# Patient Record
Sex: Male | Born: 1940 | Race: Black or African American | Hispanic: No | Smoking: Former smoker
Health system: Southern US, Community
[De-identification: ages and names within clinical notes are randomized; demographics above are authoritative.]

## PROBLEM LIST (undated history)

## (undated) DIAGNOSIS — G4730 Sleep apnea, unspecified: Secondary | ICD-10-CM

## (undated) DIAGNOSIS — E785 Hyperlipidemia, unspecified: Secondary | ICD-10-CM

## (undated) DIAGNOSIS — I2510 Atherosclerotic heart disease of native coronary artery without angina pectoris: Secondary | ICD-10-CM

## (undated) DIAGNOSIS — E669 Obesity, unspecified: Secondary | ICD-10-CM

## (undated) DIAGNOSIS — I10 Essential (primary) hypertension: Secondary | ICD-10-CM

## (undated) HISTORY — DX: Sleep apnea, unspecified: G47.30

## (undated) HISTORY — DX: Obesity, unspecified: E66.9

## (undated) HISTORY — DX: Essential (primary) hypertension: I10

## (undated) HISTORY — DX: Hyperlipidemia, unspecified: E78.5

## (undated) HISTORY — DX: Atherosclerotic heart disease of native coronary artery without angina pectoris: I25.10

---

## 2002-07-09 ENCOUNTER — Encounter: Payer: Self-pay | Admitting: Internal Medicine

## 2002-07-09 ENCOUNTER — Encounter: Admission: RE | Admit: 2002-07-09 | Discharge: 2002-07-09 | Payer: Self-pay | Admitting: Internal Medicine

## 2003-11-01 HISTORY — PX: DOPPLER ECHOCARDIOGRAPHY: SHX263

## 2003-11-01 HISTORY — PX: OTHER SURGICAL HISTORY: SHX169

## 2004-03-02 ENCOUNTER — Ambulatory Visit (HOSPITAL_COMMUNITY): Admission: RE | Admit: 2004-03-02 | Discharge: 2004-03-02 | Payer: Self-pay | Admitting: Gastroenterology

## 2005-02-15 ENCOUNTER — Encounter: Admission: RE | Admit: 2005-02-15 | Discharge: 2005-02-15 | Payer: Self-pay | Admitting: Internal Medicine

## 2006-04-21 ENCOUNTER — Encounter: Admission: RE | Admit: 2006-04-21 | Discharge: 2006-04-21 | Payer: Self-pay | Admitting: Internal Medicine

## 2006-09-08 HISTORY — PX: CARDIAC CATHETERIZATION: SHX172

## 2008-10-18 HISTORY — PX: OTHER SURGICAL HISTORY: SHX169

## 2010-10-24 ENCOUNTER — Encounter: Payer: Self-pay | Admitting: Internal Medicine

## 2010-10-24 ENCOUNTER — Ambulatory Visit (INDEPENDENT_AMBULATORY_CARE_PROVIDER_SITE_OTHER)
Admission: RE | Admit: 2010-10-24 | Discharge: 2010-10-24 | Disposition: A | Discharge status: Home / Self Care | Payer: Medicare Other | Source: Ambulatory Visit | Attending: Internal Medicine | Admitting: Internal Medicine

## 2010-10-24 ENCOUNTER — Ambulatory Visit (INDEPENDENT_AMBULATORY_CARE_PROVIDER_SITE_OTHER): Payer: Medicare Other | Admitting: Internal Medicine

## 2010-10-24 VITALS — BP 132/80 | HR 61 | Ht 68.0 in | Wt 267.0 lb

## 2010-10-24 DIAGNOSIS — R0789 Other chest pain: Secondary | ICD-10-CM

## 2010-10-24 DIAGNOSIS — M069 Rheumatoid arthritis, unspecified: Secondary | ICD-10-CM | POA: Insufficient documentation

## 2010-10-24 DIAGNOSIS — J3089 Other allergic rhinitis: Secondary | ICD-10-CM

## 2010-10-24 DIAGNOSIS — J309 Allergic rhinitis, unspecified: Secondary | ICD-10-CM

## 2010-10-24 DIAGNOSIS — R079 Chest pain, unspecified: Secondary | ICD-10-CM | POA: Insufficient documentation

## 2010-10-24 DIAGNOSIS — Z87891 Personal history of nicotine dependence: Secondary | ICD-10-CM

## 2010-10-24 NOTE — Progress Notes (Signed)
  Subjective:    Patient ID: Larry [name], male    DOB: 26-Feb-1941, 70 y.o.   MRN: [id number]  HPI 70 yo male former smoker, self referred with concern of chest pain. Describes intermittent, bilateral anterior rib cage pains felt under the pectoral areas. New x 1 month, Comes and goes, associated with cough or as gets up out of bed/ from lying, bending or sitting, a few times per week. Noted as fleeting and not treated. Relieved by standing after which the chest wall feels numb. Denies unusual or increased cough or dyspnea. Similar discomfort self limited 2 years ago. Hx perennial rhinitis, watering eyes, remote pneumonia, known aortic aneurysm and HBP. Denies asthma or nodes, blood, fever or purulent discharge. Dx's with "slight" Rheumatoid arthritis. Evaluated by cardiology- Dr Allyson Sabal- told not his heart. Past hx sleep apnea. Quit CPAP after 3 years- told he no longer needed it.   Review of Systems Constitutional:   No weight loss, night sweats,  Fevers, chills, fatigue, lassitude. HEENT:   No headaches,  Difficulty swallowing,  Tooth/dental problems,  Sore throat,                No sneezing, itching, ear ache, nasal congestion, post nasal drip,   CV:  Chest pain per HPI. , No- Orthopnea, PND, swelling in lower extremities, anasarca, dizziness, palpitations  GI  No heartburn, indigestion, abdominal pain, nausea, vomiting, diarrhea, change in bowel habits, loss of appetite  Resp: No shortness of breath with exertion or at rest.  No excess mucus, no productive cough,  No non-productive cough,  No coughing up of blood.  No change in color of mucus.  No wheezing.   Skin: no rash or lesions.  GU: no dysuria, change in color of urine, no urgency or frequency.  No flank pain.  MS:  No joint pain or swelling.  No decreased range of motion.  No back pain.  Psych:  No change in mood or affect. No depression or anxiety.  No memory loss.      Objective:   Physical Exam General- Alert, Oriented,  Affect-appropriate, Distress- none acute  Skin- rash-none, lesions- none, excoriation- none  Lymphadenopathy- none  Head- atraumatic  Eyes- Gross vision intact, PERRLA, conjunctivae clear secretions  Ears- Hearing, canals, Tm - normal  Nose- Clear,No- Septal dev, mucus, polyps, erosion, perforation   Throat- Mallampati II , mucosa clear , drainage- none, tonsils- atrophic  Neck- flexible , trachea midline, no stridor , thyroid nl, carotid no bruit  Chest - symmetrical excursion , unlabored     Heart/CV- RRR , no murmur , no gallop  , no rub, nl s1 s2                     - JVD- none , edema- none, stasis changes- none, varices- none     Lung- clear to P&A, wheeze- none, cough- none , dullness-none, rub- none     Chest wall-   Abd- tender-no, distended-no, bowel sounds- active borborygmi, HSM- no  Br/ Gen/ Rectal- Not done, not indicated  Extrem- cyanosis- none, clubbing, none, atrophy- none, strength- nl  Neuro- grossly intact to observation         Assessment & Plan:

## 2010-10-24 NOTE — Patient Instructions (Addendum)
Orders- schedule CXR dx chest pain             - schedule PFT  Dx chest pain  Watch for effect of bending, lifting or pushing hard- is pain maybe worse then or the next day? Watch for any connection to how your abdomen or digestion feel.

## 2010-10-28 ENCOUNTER — Encounter: Payer: Self-pay | Admitting: Internal Medicine

## 2010-10-28 NOTE — Assessment & Plan Note (Signed)
Mild perennial rhinitis, noted.

## 2010-10-28 NOTE — Assessment & Plan Note (Addendum)
This is intercostal or possibly radicular discomfort related to compression against the rib cage. Bowel sounds are active enough that gas discomfort might be part of the problem.Hx of RA suggests possible bone spurs in spine, which could be causing positional discomfort.  We will check CXR and PFT.

## 2010-10-31 ENCOUNTER — Institutional Professional Consult (permissible substitution): Payer: Self-pay | Admitting: Emergency Medicine

## 2010-11-06 ENCOUNTER — Encounter: Payer: Self-pay | Admitting: Internal Medicine

## 2010-11-13 NOTE — Progress Notes (Signed)
Quick Note:  Spoke with patient-aware of results. ______ 

## 2010-11-14 ENCOUNTER — Ambulatory Visit (INDEPENDENT_AMBULATORY_CARE_PROVIDER_SITE_OTHER): Payer: Medicare Other | Admitting: Internal Medicine

## 2010-11-14 DIAGNOSIS — R0789 Other chest pain: Secondary | ICD-10-CM

## 2010-11-14 LAB — PULMONARY FUNCTION TEST

## 2010-11-14 NOTE — Progress Notes (Signed)
PFT done today. 

## 2010-11-19 ENCOUNTER — Encounter: Payer: Self-pay | Admitting: Internal Medicine

## 2010-11-26 HISTORY — PX: NM MYOCAR PERF WALL MOTION: HXRAD629

## 2010-12-03 ENCOUNTER — Ambulatory Visit (INDEPENDENT_AMBULATORY_CARE_PROVIDER_SITE_OTHER): Payer: Medicare Other | Admitting: Internal Medicine

## 2010-12-03 ENCOUNTER — Encounter: Payer: Self-pay | Admitting: Internal Medicine

## 2010-12-03 VITALS — BP 110/76 | HR 80 | Ht 68.0 in | Wt 264.4 lb

## 2010-12-03 DIAGNOSIS — J45909 Unspecified asthma, uncomplicated: Secondary | ICD-10-CM

## 2010-12-03 DIAGNOSIS — R0789 Other chest pain: Secondary | ICD-10-CM

## 2010-12-03 NOTE — Patient Instructions (Addendum)
Sample albuterol rescue inhaler-  2 puffs, up to 4 times daily as needed for chest tightness, wheeze, shortness of breath.  If it is useful, your family doctor could give you a prescription for it, or you can return here as needed for chest/ breathing issues.   Cc Dr Allyson Sabal

## 2010-12-03 NOTE — Progress Notes (Signed)
Subjective:    Patient ID: Larry [name], male    DOB: 1940/11/01, 70 y.o.   MRN: [id number]  HPI    Review of Systems     Objective:   Physical Exam        Assessment & Plan:   Subjective:    Patient ID: Larry [name], male    DOB: [birth year], 70 y.o.   MRN: [id number]  HPI 70 yo male former smoker, self referred with concern of chest pain. Describes intermittent, bilateral anterior rib cage pains felt under the pectoral areas. New x 1 month, Comes and goes, associated with cough or as gets up out of bed/ from lying, bending or sitting, a few times per week. Noted as fleeting and not treated. Relieved by standing after which the chest wall feels numb. Denies unusual or increased cough or dyspnea. Similar discomfort self limited 2 years ago. Hx perennial rhinitis, watering eyes, remote pneumonia, known aortic aneurysm and HBP. Denies asthma or nodes, blood, fever or purulent discharge. Dx's with "slight" Rheumatoid arthritis. Evaluated by cardiology- Dr Allyson Sabal- told not his heart. Past hx sleep apnea. Quit CPAP after 3 years- told he no longer needed it.   12/03/10-70 yo male former smoker, self referred with concern of chest pain. Last week went to [facility]. Had stress test, xrays. All results were normal. Hurting was fading overnight there- he was treated with NTG and a pain pill. No recurrence of pain and no ongoing treatment., He is going back for f/u with Dr Allyson Sabal / cardiology. Denies dyspnea, but wants results of his PFT: PFT-10/24/10- mild reversible small airway obstruction "asthma". FEV1 220/81%; FEV1/FVC 0.79; DLCO 81% CXR- 11/18/10- borderline CE, minimal bibasilar atelectasis.  Review of Systems Constitutional:   No weight loss, night sweats,  Fevers, chills, fatigue, lassitude. HEENT:   No headaches,  Difficulty swallowing,  Tooth/dental problems,  Sore throat,                No sneezing, itching, ear ache, nasal congestion, post nasal drip,   CV:  Chest  pain per HPI. ,               No- Orthopnea, PND, swelling in lower extremities, anasarca, dizziness, palpitations  GI  No heartburn, indigestion, abdominal pain, nausea, vomiting, diarrhea, change in bowel habits, loss of appetite  Resp: No shortness of breath with exertion or at rest.  No excess mucus, no productive cough,  No non-productive cough,  No coughing up of blood.  No change in color of mucus.  No wheezing.   Skin: no rash or lesions.  GU: no dysuria, change in color of urine, no urgency or frequency.  No flank pain.  MS:  No joint pain or swelling.  No decreased range of motion.  No back pain.  Psych:  No change in mood or affect. No depression or anxiety.  No memory loss.      Objective:   Physical Exam General- Alert, Oriented, Affect-appropriate, Distress- none acute  Skin- rash-none, lesions- none, excoriation- none  Lymphadenopathy- none  Head- atraumatic  Eyes- Gross vision intact, PERRLA, conjunctivae clear secretions  Ears- Hearing, canals, Tm - normal  Nose- Clear,No- Septal dev, mucus, polyps, erosion, perforation   Throat- Mallampati II , mucosa clear , drainage- none, tonsils- atrophic  Neck- flexible , trachea midline, no stridor , thyroid nl, carotid no bruit  Chest - symmetrical excursion , unlabored     Heart/CV- RRR , no murmur , no gallop  , no  rub, nl s1 s2                     - JVD- none , edema- none, stasis changes- none, varices- none     Lung- clear to P&A, wheeze- none, cough- none , dullness-none, rub- none     Chest wall-   Abd- tender-no, distended-no,      bowel sounds- active borborygmi,     HSM- no  Br/ Gen/ Rectal- Not done, not indicated  Extrem- cyanosis- none, clubbing, none, atrophy- none, strength- nl  Neuro- grossly intact to observation         Assessment & Plan:

## 2010-12-03 NOTE — Assessment & Plan Note (Addendum)
Resolved without explanation. Consider possibility of gas pains, because of active bowel sounds heard initially.

## 2010-12-03 NOTE — Assessment & Plan Note (Addendum)
He has a mild reversible small airway obstruction based on PFT. We reviewed his studies together. I don't know how this would cause pain, unless the force of cough or sneeze hurt his chest wall. Possible aerophagia. We will give sample rescue inhaler. Unless he needs to see me again at some time, he will ask his PCP to refill it if needed.

## 2010-12-08 ENCOUNTER — Encounter: Payer: Self-pay | Admitting: Internal Medicine

## 2011-04-16 ENCOUNTER — Encounter: Payer: Self-pay | Admitting: Family Medicine

## 2011-04-16 ENCOUNTER — Ambulatory Visit (INDEPENDENT_AMBULATORY_CARE_PROVIDER_SITE_OTHER): Payer: Medicare Other | Admitting: Family Medicine

## 2011-04-16 VITALS — BP 132/83 | HR 74 | Ht 68.0 in | Wt 267.0 lb

## 2011-04-16 DIAGNOSIS — J309 Allergic rhinitis, unspecified: Secondary | ICD-10-CM

## 2011-04-16 DIAGNOSIS — I719 Aortic aneurysm of unspecified site, without rupture: Secondary | ICD-10-CM

## 2011-04-16 DIAGNOSIS — K429 Umbilical hernia without obstruction or gangrene: Secondary | ICD-10-CM

## 2011-04-16 DIAGNOSIS — E785 Hyperlipidemia, unspecified: Secondary | ICD-10-CM

## 2011-04-16 DIAGNOSIS — I10 Essential (primary) hypertension: Secondary | ICD-10-CM

## 2011-04-16 DIAGNOSIS — Z9109 Other allergy status, other than to drugs and biological substances: Secondary | ICD-10-CM

## 2011-04-16 MED ORDER — FEXOFENADINE-PSEUDOEPHED ER 180-240 MG PO TB24: 1.0000 | ORAL_TABLET | Freq: Every day | ORAL | Status: AC

## 2011-04-16 MED ORDER — FLUTICASONE PROPIONATE 50 MCG/ACT NA SUSP: 2.0000 | Freq: Every day | NASAL | Status: DC

## 2011-04-16 NOTE — Progress Notes (Signed)
  Subjective:    Patient ID: Larry [name], male    DOB: 1940-08-30, 70 y.o.   MRN: [id number]  Hip Pain   Hyperlipidemia  Hypertension This is a chronic problem. The current episode started more than 1 year ago. The problem is unchanged. The problem is controlled. Pertinent negatives include no anxiety, malaise/fatigue or peripheral edema. There are no associated agents to hypertension. Risk factors for coronary artery disease include dyslipidemia, male gender, obesity and smoking/tobacco exposure. Past treatments include angiotensin blockers and calcium channel blockers. The current treatment provides significant improvement. Compliance problems include exercise.  anyeurism.      Review of Systems  Constitutional: Negative for malaise/fatigue.  HENT: Positive for congestion and postnasal drip.   Gastrointestinal:       Swelling aroufd the lower umbilicus      BP 132/83  Pulse 74  Ht 5\' 8"  (1.727 m)  Wt 267 lb (121.11 kg)  BMI 40.60 kg/m2  SpO2 96%  Objective:   Physical Exam  Constitutional: He is oriented to person, place, and time. He appears well-developed and well-nourished.       obesity  HENT:  Right Ear: External ear normal.  Left Ear: External ear normal.  Nose: Mucosal edema and rhinorrhea present. No sinus tenderness, nasal deformity or septal deviation.  No foreign bodies. Right sinus exhibits no maxillary sinus tenderness and no frontal sinus tenderness. Left sinus exhibits no maxillary sinus tenderness and no frontal sinus tenderness.  Mouth/Throat: Oropharynx is clear and moist. Normal dentition. No dental abscesses or dental caries.  Neck: Normal range of motion. Neck supple.  Cardiovascular: Normal rate, regular rhythm and normal heart sounds.  Exam reveals no gallop and no friction rub.   No murmur heard. Pulmonary/Chest: Effort normal and breath sounds normal.  Abdominal: Soft.       umbical hernia present small  Neurological: He is alert and oriented to  person, place, and time.  Skin: Skin is warm.          Assessment & Plan:  Hypertension continue w/ Dr Allyson Sabal Hyperlipidemia continue w/Dr Allyson Sabal Aneurism continue w/Dr Allyson Sabal Allergy flonase nasal spray Allegra D 180 short term Reassure that the umbilical hernia is small enough thatwe can watch it and would only treat if it gets bigger

## 2011-04-16 NOTE — Patient Instructions (Signed)
Fluticasone nasal solution What is this medicine? FLUTICASONE (floo TIK a sone) is a corticosteroid. It helps decrease inflammation in your nose. This medicine is used to treat the symptoms of allergies like sneezing, itching, and runny or stuffy nose. This medicine may be used for other purposes; ask your health care provider or pharmacist if you have questions. What should I tell my health care provider before I take this medicine? They need to know if you have any of these conditions: -infection, like tuberculosis, herpes, or fungal infection -recent surgery on nose or sinuses -taking corticosteroid by mouth -an unusual or allergic reaction to fluticasone, steroids, other medicines, foods, dyes, or preservatives -pregnant or trying to get pregnant -breast-feeding How should I use this medicine? This medicine is for use in the nose. Follow the directions on your prescription label. This medicine works best if used regularly. Do not use more often than directed. Make sure that you are using your nasal spray correctly. Ask you doctor or health care provider if you have any questions. Talk to your pediatrician regarding the use of this medicine in children. While this drug may be prescribed for children as young as 4 years old for selected conditions, precautions do apply. Overdosage: If you think you have taken too much of this medicine contact a poison control center or emergency room at once. NOTE: This medicine is only for you. Do not share this medicine with others. What if I miss a dose? If you miss a dose, use it as soon as you remember. If it is almost time for your next dose, use only that dose and continue with your regular schedule. Do not use double or extra doses. What may interact with this medicine? -ketoconazole -metyrapone -some medicines for HIV -vaccines This list may not describe all possible interactions. Give your health care provider a list of all the medicines, herbs,  non-prescription drugs, or dietary supplements you use. Also tell them if you smoke, drink alcohol, or use illegal drugs. Some items may interact with your medicine. What should I watch for while using this medicine? Visit your doctor or health care professional for regular checks on your progress. Some symptoms may improve within 12 hours after starting use. Check with your doctor or health care professional if there is no improvement in your condition after 3 weeks of use. Do not come in contact with people who have chickenpox or the measles while you are taking this medicine. If you do, call your doctor right away. What side effects may I notice from receiving this medicine? Side effects that you should report to your doctor or health care professional as soon as possible: -allergic reactions like skin rash, itching or hives, swelling of the face, lips, or tongue -changes in vision -flu-like symptoms -white patches or sores in the mouth or nose Side effects that usually do not require medical attention (report to your doctor or health care professional if they continue or are bothersome): -burning or irritation inside the nose or throat -cough -headache -nosebleed -unusual taste or smell This list may not describe all possible side effects. Call your doctor for medical advice about side effects. You may report side effects to FDA at 1-800-FDA-1088. Where should I keep my medicine? Keep out of the reach of children. Store at room temperature between 15 and 30 degrees C (59 and 86 degrees F). Throw away any unused medicine after the expiration date. NOTE: This sheet is a summary. It may not cover all possible   information. If you have questions about this medicine, talk to your doctor, pharmacist, or health care provider.  2012, [employer]vier/[employer] Standard. (05/10/2008 10:40:16 AM)Allergies, Generic Allergies may happen from anything your body is sensitive to. This may be food, medicines, pollens,  chemicals, and nearly anything around you in everyday life that produces allergens. An allergen is anything that causes an allergy producing substance. Heredity is often a factor in causing these problems. This means you may have some of the same allergies as your parents. Food allergies happen in all age groups. Food allergies are some of the most severe and life threatening. Some common food allergies are cow's milk, seafood, eggs, nuts, wheat, and soybeans. SYMPTOMS   Swelling around the mouth.   An itchy red rash or hives.   Vomiting or diarrhea.   Difficulty breathing.  SEVERE ALLERGIC REACTIONS ARE LIFE-THREATENING. This reaction is called anaphylaxis. It can cause the mouth and throat to swell and cause difficulty with breathing and swallowing. In severe reactions only a trace amount of food (for example, peanut oil in a salad) may cause death within seconds. Seasonal allergies occur in all age groups. These are seasonal because they usually occur during the same season every year. They may be a reaction to molds, grass pollens, or tree pollens. Other causes of problems are house dust mite allergens, pet dander, and mold spores. The symptoms often consist of nasal congestion, a runny itchy nose associated with sneezing, and tearing itchy eyes. There is often an associated itching of the mouth and ears. The problems happen when you come in contact with pollens and other allergens. Allergens are the particles in the air that the body reacts to with an allergic reaction. This causes you to release allergic antibodies. Through a chain of events, these eventually cause you to release histamine into the blood stream. Although it is meant to be protective to the body, it is this release that causes your discomfort. This is why you were given anti-histamines to feel better. If you are unable to pinpoint the offending allergen, it may be determined by skin or blood testing. Allergies cannot be cured but  can be controlled with medicine. Hay fever is a collection of all or some of the seasonal allergy problems. It may often be treated with simple over-the-counter medicine such as diphenhydramine. Take medicine as directed. Do not drink alcohol or drive while taking this medicine. Check with your caregiver or package insert for child dosages. If these medicines are not effective, there are many new medicines your caregiver can prescribe. Stronger medicine such as nasal spray, eye drops, and corticosteroids may be used if the first things you try do not work well. Other treatments such as immunotherapy or desensitizing injections can be used if all else fails. Follow up with your caregiver if problems continue. These seasonal allergies are usually not life threatening. They are generally more of a nuisance that can often be handled using medicine. HOME CARE INSTRUCTIONS   If unsure what causes a reaction, keep a diary of foods eaten and symptoms that follow. Avoid foods that cause reactions.   If hives or rash are present:   Take medicine as directed.   You may use an over-the-counter antihistamine (diphenhydramine) for hives and itching as needed.   Apply cold compresses (cloths) to the skin or take baths in cool water. Avoid hot baths or showers. Heat will make a rash and itching worse.   If you are severely allergic:   Following a  treatment for a severe reaction, hospitalization is often required for closer follow-up.   Wear a medic-alert bracelet or necklace stating the allergy.   You and your family must learn how to give adrenaline or use an anaphylaxis kit.   If you have had a severe reaction, always carry your anaphylaxis kit or EpiPen with you. Use this medicine as directed by your caregiver if a severe reaction is occurring. Failure to do so could have a fatal outcome.  SEEK MEDICAL CARE IF:  You suspect a food allergy. Symptoms generally happen within 30 minutes of eating a food.     Your symptoms have not gone away within 2 days or are getting worse.   You develop new symptoms.   You want to retest yourself or your child with a food or drink you think causes an allergic reaction. Never do this if an anaphylactic reaction to that food or drink has happened before. Only do this under the care of a caregiver.  SEEK IMMEDIATE MEDICAL CARE IF:   You have difficulty breathing, are wheezing, or have a tight feeling in your chest or throat.   You have a swollen mouth, or you have hives, swelling, or itching all over your body.   You have had a severe reaction that has responded to your anaphylaxis kit or an EpiPen. These reactions may return when the medicine has worn off. These reactions should be considered life threatening.  MAKE SURE YOU:   Understand these instructions.   Will watch your condition.   Will get help right away if you are not doing well or get worse.  Document Released: 08/20/2002 Document Revised: 02/06/2011 Document Reviewed: 01/25/2008 [facility] Patient Information 2012 [place], [place].

## 2011-04-17 ENCOUNTER — Encounter: Payer: Self-pay | Admitting: Family Medicine

## 2011-04-17 DIAGNOSIS — K429 Umbilical hernia without obstruction or gangrene: Secondary | ICD-10-CM | POA: Insufficient documentation

## 2011-04-17 DIAGNOSIS — I719 Aortic aneurysm of unspecified site, without rupture: Secondary | ICD-10-CM | POA: Insufficient documentation

## 2012-11-16 ENCOUNTER — Ambulatory Visit (INDEPENDENT_AMBULATORY_CARE_PROVIDER_SITE_OTHER): Payer: MEDICARE | Admitting: Cardiology

## 2012-11-16 ENCOUNTER — Encounter: Payer: Self-pay | Admitting: Cardiology

## 2012-11-16 VITALS — BP 112/78 | HR 86 | Ht 68.0 in | Wt 265.0 lb

## 2012-11-16 DIAGNOSIS — N289 Disorder of kidney and ureter, unspecified: Secondary | ICD-10-CM

## 2012-11-16 DIAGNOSIS — E669 Obesity, unspecified: Secondary | ICD-10-CM

## 2012-11-16 DIAGNOSIS — R079 Chest pain, unspecified: Secondary | ICD-10-CM

## 2012-11-16 DIAGNOSIS — I2510 Atherosclerotic heart disease of native coronary artery without angina pectoris: Secondary | ICD-10-CM | POA: Insufficient documentation

## 2012-11-16 DIAGNOSIS — E785 Hyperlipidemia, unspecified: Secondary | ICD-10-CM

## 2012-11-16 DIAGNOSIS — I10 Essential (primary) hypertension: Secondary | ICD-10-CM

## 2012-11-16 MED ORDER — ASPIRIN EC 81 MG PO TBEC: 81.0000 mg | DELAYED_RELEASE_TABLET | Freq: Every day | ORAL | Status: DC

## 2012-11-16 NOTE — Progress Notes (Signed)
11/16/2012 Larry [name]   1941/02/27  [id number]  Primary Physicia Pearson Grippe, MD Primary Cardiologist: Dr Allyson Sabal  HPI:  72 y/o we saw in 2008 for chest pain. Cath showed 40-50% LAD and no ather disease. He was admitted in June to [place] and says he had a stress test there that was OK. He has been having vague chest pain the last week. "Pain in my lungs". He was seen in the ER in [place] over the weekend. He saw Dr Selena Batten today and was referred to [place]. He denieas any radiation to his jaw or arms. His pain is not exertional. No obvious alleviating factors.   Current Outpatient Prescriptions  Medication Sig Dispense Refill  . amLODipine (NORVASC) 5 MG tablet Take 5 mg by mouth daily.        . cholecalciferol (VITAMIN D) 1000 UNITS tablet Take 1,000 Units by mouth daily.        . Coral Calcium 1000 (390 CA) MG TABS Take 1 tablet by mouth daily.        . finasteride (PROSCAR) 5 MG tablet Take 5 mg by mouth daily.      Marland Kitchen losartan-hydrochlorothiazide (HYZAAR) 100-12.5 MG per tablet Take 1 tablet by mouth daily.        . rosuvastatin (CRESTOR) 20 MG tablet Take 20 mg by mouth daily.        . sildenafil (VIAGRA) 100 MG tablet Take 100 mg by mouth daily as needed.        . tamsulosin (FLOMAX) 0.4 MG CAPS Take 0.4 mg by mouth daily.      . fluticasone (FLONASE) 50 MCG/ACT nasal spray Place 2 sprays into the nose daily.  1 g  6   No current facility-administered medications for this visit.    Allergies  Allergen Reactions  . Penicillins Rash    History   Social History  . Marital Status: Single    Spouse Name: N/A    Number of Children: N/A  . Years of Education: N/A   Occupational History  . Not on file.   Social History Main Topics  . Smoking status: Former Smoker -- 0.50 packs/day for 40 years    Types: Cigarettes    Quit date: 06/11/2003  . Smokeless tobacco: Not on file  . Alcohol Use: Yes     Comment: occasional  . Drug Use: No  . Sexually Active: Not on file   Other  Topics Concern  . Not on file   Social History Narrative  . No narrative on file     Review of Systems: General: negative for chills, fever, night sweats or weight changes. Recent sinusitus Cardiovascular: negative for chest pain, dyspnea on exertion, edema, orthopnea, palpitations, paroxysmal nocturnal dyspnea or shortness of breath Dermatological: negative for rash Respiratory: negative for cough or wheezing Urologic: negative for hematuria Abdominal: negative for nausea, vomiting, diarrhea, bright red blood per rectum, melena, or hematemesis Neurologic: negative for visual changes, syncope, or dizziness All other systems reviewed and are otherwise negative except as noted above.    Blood pressure 112/78, pulse 86, height 5\' 8"  (1.727 m), weight 265 lb (120.203 kg).  General appearance: alert, cooperative, appears stated age, no distress and morbidly obese Neck: no adenopathy, no carotid bruit and no JVD Lungs: crackles Rt base Heart: regular rate and rhythm Abdomen: obese Extremities: No edema Skin: cool and dry Neurologic: Grossly normal  EKG  EKG: normal EKG, normal sinus rhythm, unchanged from previous tracings.  ASSESSMENT AND PLAN:  Chest pain Describes discomfort under his breasts. He had a "stress test" in 2012 in [place] whenhe was admitted with chest pain there June 2012  Coronary arteriosclerosis 40-50% LAD in 2008  Obesity BMI 40  Hyperlipidemia LDL 132 June 2014, statin intol  Renal insufficiency SCr 1.13 November 2012. It was 1.3 in  2012  Hypertension controlled   PLAN  2 day Myoview, follow up with Dr Allyson Sabal. Add ASA.   Larry [name] KPA-C 11/16/2012 4:07 PM

## 2012-11-16 NOTE — Patient Instructions (Signed)
Stress test to be scheduled. Start a baby aspirin a day

## 2012-11-16 NOTE — Assessment & Plan Note (Signed)
SCr 1.13 November 2012. It was 1.3 in  2012

## 2012-11-16 NOTE — Assessment & Plan Note (Signed)
BMI 40 

## 2012-11-16 NOTE — Assessment & Plan Note (Signed)
Describes discomfort under his breasts. He had a "stress test" in 2012 in [place] whenhe was admitted with chest pain there June 2012

## 2012-11-16 NOTE — Assessment & Plan Note (Signed)
40-50% LAD in 2008

## 2012-11-16 NOTE — Assessment & Plan Note (Signed)
controlled 

## 2012-11-16 NOTE — Assessment & Plan Note (Signed)
LDL 132 June 2014, statin intol

## 2012-11-25 ENCOUNTER — Encounter (HOSPITAL_COMMUNITY): Payer: MEDICARE

## 2013-02-02 ENCOUNTER — Other Ambulatory Visit (HOSPITAL_COMMUNITY): Payer: Self-pay | Admitting: Cardiovascular Disease

## 2013-02-05 ENCOUNTER — Encounter (HOSPITAL_COMMUNITY): Payer: MEDICARE

## 2013-02-10 ENCOUNTER — Ambulatory Visit (HOSPITAL_COMMUNITY)
Admission: RE | Admit: 2013-02-10 | Discharge: 2013-02-10 | Disposition: A | Discharge status: Home / Self Care | Payer: MEDICARE | Source: Ambulatory Visit | Attending: Cardiology | Admitting: Cardiology

## 2013-02-10 DIAGNOSIS — I739 Peripheral vascular disease, unspecified: Secondary | ICD-10-CM | POA: Insufficient documentation

## 2013-02-10 DIAGNOSIS — I714 Abdominal aortic aneurysm, without rupture, unspecified: Secondary | ICD-10-CM | POA: Insufficient documentation

## 2013-02-10 DIAGNOSIS — R079 Chest pain, unspecified: Secondary | ICD-10-CM

## 2013-02-10 DIAGNOSIS — R5381 Other malaise: Secondary | ICD-10-CM | POA: Insufficient documentation

## 2013-02-10 DIAGNOSIS — J45909 Unspecified asthma, uncomplicated: Secondary | ICD-10-CM | POA: Insufficient documentation

## 2013-02-10 DIAGNOSIS — E669 Obesity, unspecified: Secondary | ICD-10-CM | POA: Insufficient documentation

## 2013-02-10 DIAGNOSIS — Z87891 Personal history of nicotine dependence: Secondary | ICD-10-CM | POA: Insufficient documentation

## 2013-02-10 DIAGNOSIS — I10 Essential (primary) hypertension: Secondary | ICD-10-CM | POA: Insufficient documentation

## 2013-02-10 MED ORDER — REGADENOSON 0.4 MG/5ML IV SOLN
0.4000 mg | Freq: Once | INTRAVENOUS | Status: AC
  Administered 2013-02-10: 0.4 mg via INTRAVENOUS

## 2013-02-10 MED ORDER — TECHNETIUM TC 99M SESTAMIBI GENERIC - CARDIOLITE
30.0000 | Freq: Once | INTRAVENOUS | Status: AC | PRN
  Administered 2013-02-10: 30 via INTRAVENOUS

## 2013-02-10 NOTE — Procedures (Addendum)
[facility] [address] [place] [address] [place] [address] [phone]  Cardiology Nuclear Med Study  Larry [name] is a 72 y.o. male     MRN : [id number]     DOB: 10/24/1940  Procedure Date: 02/10/2013  Nuclear Med Background Indication for Stress Test:  Evaluation for Ischemia History:  Asthma and AAA; Cardiac Risk Factors: History of Smoking, Hypertension, Lipids, Obesity and PVD  Symptoms:  Chest Pain and Fatigue   Nuclear Pre-Procedure Caffeine/Decaff Intake:  12:00am NPO After: 10AM   IV Site: R Hand  IV 0.9% NS with Angio Cath:  22g  Chest Size (in):  44"  IV Started by: Emmit Pomfret, RN  Height: 5\' 8"  (1.727 m)  Cup Size: n/a  BMI:  Body mass index is 40.15 kg/(m^2). Weight:  264 lb (119.75 kg)   Tech Comments:  N/A    Nuclear Med Study 1 or 2 day study: 2 day  Stress Test Type:  Lexiscan  Order Authorizing Provider:  Nanetta Batty, MD   Resting Radionuclide: Technetium 77m Sestamibi  Resting Radionuclide Dose: 30.9 mCi   Stress Radionuclide:  Technetium 1m Sestamibi  Stress Radionuclide Dose: 31.5 mCi           Stress Protocol Rest HR: 71 Stress HR: 87  Rest BP: 131/75 Stress BP: 126/77  Exercise Time (min): n/a METS: n/a   Predicted Max HR: 147 bpm % Max HR: 63.27 bpm Rate Pressure Product: 86578  Dose of Adenosine (mg):  n/a Dose of Lexiscan: 0.4 mg  Dose of Atropine (mg): n/a Dose of Dobutamine: n/a mcg/kg/min (at max HR)  Stress Test Technologist: Esperanza Sheets, CCT Nuclear Technologist: Koren Shiver, CNMT   Rest Procedure:  Myocardial perfusion imaging was performed at rest 45 minutes following the intravenous administration of Technetium 87m Sestamibi. Stress Procedure:  The patient received IV Lexiscan 0.4 mg over 15-seconds.  Technetium 47m Sestamibi injected at 30-seconds.  There were no significant changes with Lexiscan.  Quantitative spect images were obtained after a 45 minute  delay.  Transient Ischemic Dilatation (Normal <1.22):  1.31 Lung/Heart Ratio (Normal <0.45):  0.33 QGS EDV:  107 ml QGS ESV:  60 ml LV Ejection Fraction: 44%  Signed by .      Rest ECG: NSR - Normal EKG  Stress ECG: No significant change from baseline ECG  QPS Raw Data Images:  Normal; no motion artifact; normal heart/lung ratio. Stress Images:  There is decreased uptake in the inferior wall. Rest Images:  Normal homogeneous uptake in all areas of the myocardium. Subtraction (SDS):  These findings are consistent with ischemia.  Impression Exercise Capacity:  Lexiscan with no exercise. BP Response:  Normal blood pressure response. Clinical Symptoms:  No significant symptoms noted. ECG Impression:  No significant ST segment change suggestive of ischemia. Comparison with Prior Nuclear Study: There is new inferior ischemia  Overall Impression:  Intermediate risk stress nuclear study There is mild to moderate ischemia in the inferior wall. The pt will follow up with Dr. Allyson Sabal  LV Wall Motion:  Mild to moderate LV dysfunction   [name], MD  02/11/2013 5:04 PM

## 2013-02-11 ENCOUNTER — Ambulatory Visit (HOSPITAL_COMMUNITY)
Admission: RE | Admit: 2013-02-11 | Discharge: 2013-02-11 | Disposition: A | Discharge status: Home / Self Care | Payer: MEDICARE | Source: Ambulatory Visit | Attending: Cardiovascular Disease | Admitting: Cardiovascular Disease

## 2013-02-11 DIAGNOSIS — R079 Chest pain, unspecified: Secondary | ICD-10-CM

## 2013-02-11 DIAGNOSIS — R5381 Other malaise: Secondary | ICD-10-CM | POA: Insufficient documentation

## 2013-02-11 MED ORDER — TECHNETIUM TC 99M SESTAMIBI GENERIC - CARDIOLITE
30.0000 | Freq: Once | INTRAVENOUS | Status: AC | PRN
  Administered 2013-02-11: 30 via INTRAVENOUS

## 2013-02-17 ENCOUNTER — Telehealth: Payer: Self-pay | Admitting: Cardiovascular Disease

## 2013-02-17 NOTE — Telephone Encounter (Signed)
Returning your call from yesterday. °

## 2013-02-17 NOTE — Telephone Encounter (Signed)
Spoke with patient and reviewed myoview results  

## 2013-02-28 ENCOUNTER — Encounter: Payer: Self-pay | Admitting: *Deleted

## 2013-03-05 ENCOUNTER — Ambulatory Visit (INDEPENDENT_AMBULATORY_CARE_PROVIDER_SITE_OTHER): Payer: MEDICARE | Admitting: Cardiovascular Disease

## 2013-03-05 ENCOUNTER — Encounter: Payer: Self-pay | Admitting: Cardiovascular Disease

## 2013-03-05 VITALS — BP 130/80 | HR 64 | Ht 68.0 in | Wt 264.0 lb

## 2013-03-05 DIAGNOSIS — R079 Chest pain, unspecified: Secondary | ICD-10-CM

## 2013-03-05 NOTE — Addendum Note (Signed)
Addended byMarella Bile. on: 03/05/2013 05:31 PM   Modules accepted: Orders

## 2013-03-05 NOTE — Patient Instructions (Addendum)
Your physician wants you to follow-up in: 6 months with Luke Kilroy PA and 1 year with Dr Berry. You will receive a reminder letter in the mail two months in advance. If you don't receive a letter, please call our office to schedule the follow-up appointment.  

## 2013-03-05 NOTE — Assessment & Plan Note (Addendum)
The patient was seen by Larry [name] [facility] recently with chest pain. He was referred back by Dr. Pearson Grippe. I last saw him in the office 04/21/12. He had a heart cath performed by myself 09/08/06 revealing mild mid LAD disease with normal LV function. A recent Myoview stress test showed mild to moderate inferior ischemia. The patient said that he has been asymptomatic since.at this point would be to follow him medically but have told him that should he have recurrent symptoms I would have a low threshold to perform cardiac catheterization.because of his new LV dysfunction with an EF of 44% by QGS and regular 2-D echo to confirm this.

## 2013-03-05 NOTE — Progress Notes (Signed)
03/05/2013 Grace Bushy   January 10, 1941  [id number]  Primary Physician Pearson Grippe, MD Primary Cardiologist: [name] MD Roseanne Reno   HPI:  The patient is a 72 year old, moderately overweight, widowed [place] American male, father of 1, grandfather to 1 grandchild who I saw a year ago. He has a history of hypertension, hyperlipidemia, obstructive sleep apnea no longer on CPAP. He had noncritical CAD by cath performed September 08, 2006, with a 40% to 50% stenosis in the mid LAD and normal LV function. Dr. Selena Batten has recently checked his lipid profile.he had an episode of chest pain and was seen by Dr. Selena Batten who referred him back here for evaluation. He sought [facility] who ordered a Myoview stress test  That showed mild to moderate anterior ischemia with an ejection fraction of 44% which is a new finding. Since he was seen here last by Marnette Burgess he has had no recurrent chest pain.  Current Outpatient Prescriptions  Medication Sig Dispense Refill  . amLODipine (NORVASC) 5 MG tablet Take 5 mg by mouth daily.        Marland Kitchen aspirin EC 81 MG tablet Take 1 tablet (81 mg total) by mouth daily.  90 tablet  3  . cholecalciferol (VITAMIN D) 1000 UNITS tablet Take 1,000 Units by mouth daily.        . Coral Calcium 1000 (390 CA) MG TABS Take 1 tablet by mouth daily.        . finasteride (PROSCAR) 5 MG tablet Take 5 mg by mouth daily.      Marland Kitchen losartan-hydrochlorothiazide (HYZAAR) 100-12.5 MG per tablet Take 1 tablet by mouth daily.        Marland Kitchen oxybutynin (DITROPAN-XL) 10 MG 24 hr tablet Take 10 mg by mouth daily.      . rosuvastatin (CRESTOR) 20 MG tablet Take 20 mg by mouth daily.        . sildenafil (VIAGRA) 100 MG tablet Take 100 mg by mouth daily as needed.        . tamsulosin (FLOMAX) 0.4 MG CAPS Take 0.4 mg by mouth daily.      . Vitamin D, Ergocalciferol, (DRISDOL) 50000 UNITS CAPS capsule 50,000 Units every 7 (seven) days.       Marland Kitchen ZETIA 10 MG tablet Take 10 mg by mouth daily.       .  fluticasone (FLONASE) 50 MCG/ACT nasal spray Place 2 sprays into the nose daily.  1 g  6   No current facility-administered medications for this visit.    Allergies  Allergen Reactions  . Penicillins Rash    History   Social History  . Marital Status: Single    Spouse Name: N/A    Number of Children: N/A  . Years of Education: N/A   Occupational History  . Not on file.   Social History Main Topics  . Smoking status: Former Smoker -- 0.50 packs/day for 40 years    Types: Cigarettes    Quit date: 06/11/2003  . Smokeless tobacco: Not on file  . Alcohol Use: Yes     Comment: occasional  . Drug Use: No  . Sexual Activity: Not on file   Other Topics Concern  . Not on file   Social History Narrative  . No narrative on file     Review of Systems: General: negative for chills, fever, night sweats or weight changes.  Cardiovascular: negative for chest pain, dyspnea on exertion, edema, orthopnea, palpitations, paroxysmal nocturnal dyspnea or shortness of  breath Dermatological: negative for rash Respiratory: negative for cough or wheezing Urologic: negative for hematuria Abdominal: negative for nausea, vomiting, diarrhea, bright red blood per rectum, melena, or hematemesis Neurologic: negative for visual changes, syncope, or dizziness All other systems reviewed and are otherwise negative except as noted above.    Blood pressure 130/80, pulse 64, height 5\' 8"  (1.727 m), weight 264 lb (119.75 kg).  General appearance: alert and no distress Neck: no adenopathy, no carotid bruit, no JVD, supple, symmetrical, trachea midline and thyroid not enlarged, symmetric, no tenderness/mass/nodules Lungs: clear to auscultation bilaterally Heart: regular rate and rhythm, S1, S2 normal, no murmur, click, rub or gallop Extremities: extremities normal, atraumatic, no cyanosis or edema  EKG not performed today  ASSESSMENT AND PLAN:   Chest pain The patient was seen by Corine Shelter [facility]  recently with chest pain. He was referred back by Dr. Pearson Grippe. I last saw him in the office 04/21/12. He had a heart cath performed by myself 09/08/06 revealing mild mid LAD disease with normal LV function. A recent Myoview stress test showed mild to moderate inferior ischemia. The patient said that he has been asymptomatic since.at this point would be to follow him medically but have told him that should he have recurrent symptoms I would have a low threshold to perform cardiac catheterization.because of his new LV dysfunction with an EF of 44% by QGS and regular 2-D echo to confirm this.      [name] MD FACP,FACC,FAHA, [facility] 03/05/2013 10:53 AM

## 2013-03-25 ENCOUNTER — Ambulatory Visit (HOSPITAL_COMMUNITY)
Admission: RE | Admit: 2013-03-25 | Discharge: 2013-03-25 | Disposition: A | Discharge status: Home / Self Care | Payer: MEDICARE | Source: Ambulatory Visit | Attending: Internal Medicine | Admitting: Internal Medicine

## 2013-03-25 DIAGNOSIS — R079 Chest pain, unspecified: Secondary | ICD-10-CM

## 2013-03-25 NOTE — Progress Notes (Signed)
2D Echo Performed 03/25/2013    Larry [name], RCS  

## 2013-03-29 ENCOUNTER — Encounter: Payer: Self-pay | Admitting: Cardiovascular Disease

## 2013-03-30 ENCOUNTER — Encounter: Payer: Self-pay | Admitting: *Deleted

## 2013-08-04 ENCOUNTER — Other Ambulatory Visit: Payer: Self-pay | Admitting: Gastroenterology

## 2013-08-06 ENCOUNTER — Encounter (HOSPITAL_COMMUNITY): Payer: Self-pay | Admitting: *Deleted

## 2013-08-06 ENCOUNTER — Encounter (HOSPITAL_COMMUNITY): Payer: Self-pay | Admitting: Pharmacy Technician

## 2013-08-24 ENCOUNTER — Ambulatory Visit: Payer: MEDICARE | Admitting: Cardiology

## 2013-08-27 ENCOUNTER — Ambulatory Visit (HOSPITAL_COMMUNITY)
Admission: RE | Admit: 2013-08-27 | Discharge: 2013-08-27 | Disposition: A | Discharge status: Home / Self Care | Payer: MEDICARE | Source: Ambulatory Visit | Attending: Gastroenterology | Admitting: Gastroenterology

## 2013-08-27 ENCOUNTER — Encounter (HOSPITAL_COMMUNITY): Payer: MEDICARE | Admitting: Anesthesiology

## 2013-08-27 ENCOUNTER — Encounter (HOSPITAL_COMMUNITY)
Admission: RE | Disposition: A | Discharge status: Home / Self Care | Payer: Self-pay | Source: Ambulatory Visit | Attending: Gastroenterology

## 2013-08-27 ENCOUNTER — Ambulatory Visit (HOSPITAL_COMMUNITY): Payer: MEDICARE | Admitting: Anesthesiology

## 2013-08-27 ENCOUNTER — Encounter (HOSPITAL_COMMUNITY): Payer: Self-pay | Admitting: Anesthesiology

## 2013-08-27 DIAGNOSIS — K5730 Diverticulosis of large intestine without perforation or abscess without bleeding: Secondary | ICD-10-CM | POA: Insufficient documentation

## 2013-08-27 DIAGNOSIS — G4730 Sleep apnea, unspecified: Secondary | ICD-10-CM | POA: Insufficient documentation

## 2013-08-27 DIAGNOSIS — K648 Other hemorrhoids: Secondary | ICD-10-CM | POA: Insufficient documentation

## 2013-08-27 DIAGNOSIS — E785 Hyperlipidemia, unspecified: Secondary | ICD-10-CM | POA: Insufficient documentation

## 2013-08-27 DIAGNOSIS — Z79899 Other long term (current) drug therapy: Secondary | ICD-10-CM | POA: Insufficient documentation

## 2013-08-27 DIAGNOSIS — R142 Eructation: Secondary | ICD-10-CM | POA: Insufficient documentation

## 2013-08-27 DIAGNOSIS — Z7982 Long term (current) use of aspirin: Secondary | ICD-10-CM | POA: Insufficient documentation

## 2013-08-27 DIAGNOSIS — R143 Flatulence: Secondary | ICD-10-CM

## 2013-08-27 DIAGNOSIS — K644 Residual hemorrhoidal skin tags: Secondary | ICD-10-CM | POA: Insufficient documentation

## 2013-08-27 DIAGNOSIS — R198 Other specified symptoms and signs involving the digestive system and abdomen: Secondary | ICD-10-CM | POA: Insufficient documentation

## 2013-08-27 DIAGNOSIS — I2510 Atherosclerotic heart disease of native coronary artery without angina pectoris: Secondary | ICD-10-CM | POA: Insufficient documentation

## 2013-08-27 DIAGNOSIS — I10 Essential (primary) hypertension: Secondary | ICD-10-CM | POA: Insufficient documentation

## 2013-08-27 DIAGNOSIS — I739 Peripheral vascular disease, unspecified: Secondary | ICD-10-CM | POA: Insufficient documentation

## 2013-08-27 DIAGNOSIS — Z87891 Personal history of nicotine dependence: Secondary | ICD-10-CM | POA: Insufficient documentation

## 2013-08-27 DIAGNOSIS — R141 Gas pain: Secondary | ICD-10-CM | POA: Insufficient documentation

## 2013-08-27 DIAGNOSIS — D126 Benign neoplasm of colon, unspecified: Secondary | ICD-10-CM | POA: Insufficient documentation

## 2013-08-27 HISTORY — PX: COLONOSCOPY WITH PROPOFOL: SHX5780

## 2013-08-27 SURGERY — COLONOSCOPY WITH PROPOFOL
Anesthesia: Monitor Anesthesia Care

## 2013-08-27 MED ORDER — PROPOFOL 10 MG/ML IV BOLUS
INTRAVENOUS | Status: AC
  Filled 2013-08-27: qty 20

## 2013-08-27 MED ORDER — PROPOFOL 10 MG/ML IV BOLUS
INTRAVENOUS | Status: DC | PRN
  Administered 2013-08-27: 20 mg via INTRAVENOUS

## 2013-08-27 MED ORDER — LACTATED RINGERS IV SOLN
INTRAVENOUS | Status: DC
  Administered 2013-08-27: 1000 mL via INTRAVENOUS

## 2013-08-27 MED ORDER — KETAMINE HCL 10 MG/ML IJ SOLN
INTRAMUSCULAR | Status: DC | PRN
  Administered 2013-08-27: 20 mg via INTRAVENOUS

## 2013-08-27 MED ORDER — LIDOCAINE HCL (CARDIAC) 20 MG/ML IV SOLN
INTRAVENOUS | Status: AC
  Filled 2013-08-27: qty 5

## 2013-08-27 MED ORDER — PHENYLEPHRINE HCL 10 MG/ML IJ SOLN
INTRAMUSCULAR | Status: DC | PRN
  Administered 2013-08-27: 40 ug via INTRAVENOUS

## 2013-08-27 MED ORDER — PROPOFOL INFUSION 10 MG/ML OPTIME
INTRAVENOUS | Status: DC | PRN
  Administered 2013-08-27: 100 ug/kg/min via INTRAVENOUS

## 2013-08-27 MED ORDER — ONDANSETRON HCL 4 MG/2ML IJ SOLN
INTRAMUSCULAR | Status: DC | PRN
  Administered 2013-08-27: 4 mg via INTRAVENOUS

## 2013-08-27 MED ORDER — ONDANSETRON HCL 4 MG/2ML IJ SOLN
INTRAMUSCULAR | Status: AC
  Filled 2013-08-27: qty 2

## 2013-08-27 MED ORDER — SODIUM CHLORIDE 0.9 % IV SOLN: INTRAVENOUS | Status: DC

## 2013-08-27 MED ORDER — PHENYLEPHRINE 40 MCG/ML (10ML) SYRINGE FOR IV PUSH (FOR BLOOD PRESSURE SUPPORT)
PREFILLED_SYRINGE | INTRAVENOUS | Status: AC
  Filled 2013-08-27: qty 10

## 2013-08-27 SURGICAL SUPPLY — 22 items

## 2013-08-27 NOTE — H&P (Signed)
   Larry [name] HPI: This is a 73 year old male who reports a change in his bowel habits.  He cannot have a decent bowel movement, per his report.  He has gas and discomfort that is intermittent. His symptoms have been ongoing for the past two months.  No issues with nausea, vomiting, diarrhea, hematochezia, or melena.  The patient denies any problems with straining.  No family history of colon cancer and in 02/2004 he had a negative screening colonoscopy.  Past Medical History  Diagnosis Date  . Hypertension   . Hyperlipidemia   . Coronary artery disease   . Sleep apnea     not using C-PAP    Past Surgical History  Procedure Laterality Date  . Doppler echocardiography  11/01/2003    LV normal,mild concentric lv hypertrophy,right ventricle mildly dilated,left atrium moderately dilated,mild mitral regurg,mild tricuspid regurg. ,mild aortic regurg  . Nm myocar perf wall motion  11/26/2010    EF 56% no ischemia ,low normal left ventricular EF  . Cardiac catheterization  09/08/2006    lad 40-50%  . Abdmonial aorta doppler  10/18/08    abd aorta -tranverse measurement 2.6 x 2.5 cm proximal  . Abd aorta doppler  11/01/2003    Family History  Problem Relation Age of Onset  . Heart failure Father   . Ovarian cancer Mother     Social History:  reports that he quit smoking about 10 years ago. His smoking use included Cigarettes. He has a 20 pack-year smoking history. He has never used smokeless tobacco. He reports that he drinks alcohol. He reports that he does not use illicit drugs.  Allergies:  Allergies  Allergen Reactions  . Penicillins Rash    Medications:  Scheduled:  Continuous: . sodium chloride    . lactated ringers 1,000 mL (08/27/13 1006)    No results found for this or any previous visit (from the past 24 hour(s)).   No results found.  ROS:  As stated above in the HPI otherwise negative.  Blood pressure 149/81, pulse 74, temperature 97.5 F (36.4 C), temperature  source Oral, resp. rate 20, height 5\' 8"  (1.727 m), weight 264 lb (119.75 kg), SpO2 96.00%.    PE: Gen: NAD, Alert and Oriented HEENT:  [place]/AT, EOMI Neck: Supple, no LAD Lungs: CTA Bilaterally CV: RRR without M/G/R ABM: Soft, NTND, +BS Ext: No C/C/E  Assessment/Plan: 1) Change in bowel habits. 2) Screening colonoscopy.  Plan: 1) Colonoscopy.  Tobie Perdue D 08/27/2013, 9:50 AM

## 2013-08-27 NOTE — Transfer of Care (Signed)
Immediate Anesthesia Transfer of Care Note  Patient: Larry [name]  Procedure(s) Performed: Procedure(s) (LRB): COLONOSCOPY WITH PROPOFOL (N/A)  Patient Location: PACU  Anesthesia Type: MAC  Level of Consciousness: sedated, patient cooperative and responds to stimulation  Airway & Oxygen Therapy: Patient Spontanous Breathing and Patient connected to face mask oxgen  Post-op Assessment: Report given to PACU RN and Post -op Vital signs reviewed and stable  Post vital signs: Reviewed and stable  Complications: No apparent anesthesia complications

## 2013-08-27 NOTE — Transfer of Care (Deleted)
Immediate Anesthesia Transfer of Care Note  Patient: Larry [name]  Procedure(s) Performed: Procedure(s) (LRB): COLONOSCOPY WITH PROPOFOL (N/A)  Patient Location: PACU  Anesthesia Type: MAC  Level of Consciousness: sedated, patient cooperative and responds to stimulation  Airway & Oxygen Therapy: Patient Spontanous Breathing and Patient connected to face mask oxgen  Post-op Assessment: Report given to PACU RN and Post -op Vital signs reviewed and stable  Post vital signs: Reviewed and stable  Complications: No apparent anesthesia complications

## 2013-08-27 NOTE — Op Note (Signed)
[facility] [place], [address]   OPERATIVE PROCEDURE REPORT  PATIENT: [name], Larry  MR#: [id number] BIRTHDATE: 04/22/1941  GENDER: Male ENDOSCOPIST: Carol Ada, MD ASSISTANT: PROCEDURE DATE: 08/27/2013 PROCEDURE:   Colonoscopy with snare polypectomy ASA CLASS:   Class III INDICATIONS:Colorectal cancer screening. MEDICATIONS: MAC sedation, administered by CRNA  DESCRIPTION OF PROCEDURE:   After the risks benefits and alternatives of the procedure were thoroughly explained, informed consent was obtained.  A digital rectal exam revealed no abnormalities of the rectum.    The Pentax Colonoscope [id number] endoscope was introduced through the anus  and advanced to the cecum, which was identified by both the appendix and ileocecal valve , No adverse events experienced.    The quality of the prep was excellent. .  The instrument was then slowly withdrawn as the colon was fully examined.     FINDINGS: A 3 mm sessile cecal polyp was removed with a cold snare. A 4 mm pedunculated ascending colon polyp was removed with a hot snare.  Scattered left sided diverticula were identified. Retroflexed views revealed internal/external hemorrhoids.     The scope was then withdrawn from the patient and the procedure terminated.  COMPLICATIONS: There were no complications.  IMPRESSION: 1) Polyps. 2) Diverticula. 3) Int/Ext hemorrhoids.  RECOMMENDATIONS: 1) Await biopsy results. 2) Repeat the colonoscopy in 5 years. 3) Amitiza 24 mcg BID. 4) Follow up in one month.  _______________________________ eSigned:  Carol Ada, MD 08/27/2013 10:58 AM

## 2013-08-27 NOTE — Anesthesia Postprocedure Evaluation (Signed)
  Anesthesia Post-op Note  Patient: Larry [name]  Procedure(s) Performed: Procedure(s) (LRB): COLONOSCOPY WITH PROPOFOL (N/A)  Patient Location: PACU  Anesthesia Type: MAC  Level of Consciousness: awake and alert   Airway and Oxygen Therapy: Patient Spontanous Breathing  Post-op Pain: mild  Post-op Assessment: Post-op Vital signs reviewed, Patient's Cardiovascular Status Stable, Respiratory Function Stable, Patent Airway and No signs of Nausea or vomiting  Last Vitals:  Filed Vitals:   08/27/13 1120  BP: 101/67  Pulse:   Temp:   Resp: 21    Post-op Vital Signs: stable   Complications: No apparent anesthesia complications

## 2013-08-27 NOTE — Discharge Instructions (Signed)
Colon Polyps Polyps are lumps of extra tissue growing inside the body. Polyps can grow in the large intestine (colon). Most colon polyps are noncancerous (benign). However, some colon polyps can become cancerous over time. Polyps that are larger than a pea may be harmful. To be safe, caregivers remove and test all polyps. CAUSES  Polyps form when mutations in the genes cause your cells to grow and divide even though no more tissue is needed. RISK FACTORS There are a number of risk factors that can increase your chances of getting colon polyps. They include:  Being older than 50 years.  Family history of colon polyps or colon cancer.  Long-term colon diseases, such as colitis or Crohn disease.  Being overweight.  Smoking.  Being inactive.  Drinking too much alcohol. SYMPTOMS  Most small polyps do not cause symptoms. If symptoms are present, they may include:  Blood in the stool. The stool may look dark red or black.  Constipation or diarrhea that lasts longer than 1 week. DIAGNOSIS People often do not know they have polyps until their caregiver finds them during a regular checkup. Your caregiver can use 4 tests to check for polyps:  Digital rectal exam. The caregiver wears gloves and feels inside the rectum. This test would find polyps only in the rectum.  Barium enema. The caregiver puts a liquid called barium into your rectum before taking X-rays of your colon. Barium makes your colon look white. Polyps are dark, so they are easy to see in the X-ray pictures.  Sigmoidoscopy. A thin, flexible tube (sigmoidoscope) is placed into your rectum. The sigmoidoscope has a light and tiny camera in it. The caregiver uses the sigmoidoscope to look at the last third of your colon.  Colonoscopy. This test is like sigmoidoscopy, but the caregiver looks at the entire colon. This is the most common method for finding and removing polyps. TREATMENT  Any polyps will be removed during a  sigmoidoscopy or colonoscopy. The polyps are then tested for cancer. PREVENTION  To help lower your risk of getting more colon polyps:  Eat plenty of fruits and vegetables. Avoid eating fatty foods.  Do not smoke.  Avoid drinking alcohol.  Exercise every day.  Lose weight if recommended by your caregiver.  Eat plenty of calcium and folate. Foods that are rich in calcium include milk, cheese, and broccoli. Foods that are rich in folate include chickpeas, kidney beans, and spinach. HOME CARE INSTRUCTIONS Keep all follow-up appointments as directed by your caregiver. You may need periodic exams to check for polyps. SEEK MEDICAL CARE IF: You notice bleeding during a bowel movement. Document Released: 02/21/2004 Document Revised: 08/19/2011 Document Reviewed: 08/06/2011 [facility] Patient Information 2014 [place]. Colonoscopy, Care After Refer to this sheet in the next few weeks. These instructions provide you with information on caring for yourself after your procedure. Your health care provider may also give you more specific instructions. Your treatment has been planned according to current medical practices, but problems sometimes occur. Call your health care provider if you have any problems or questions after your procedure. WHAT TO EXPECT AFTER THE PROCEDURE  After your procedure, it is typical to have the following:  A small amount of blood in your stool.  Moderate amounts of gas and mild abdominal cramping or bloating. HOME CARE INSTRUCTIONS  Do not drive, operate machinery, or sign important documents for 24 hours.  You may shower and resume your regular physical activities, but move at a slower pace for the  first 24 hours.  Take frequent rest periods for the first 24 hours.  Walk around or put a warm pack on your abdomen to help reduce abdominal cramping and bloating.  Drink enough fluids to keep your urine clear or pale yellow.  You may resume your normal diet as  instructed by your health care provider. Avoid heavy or fried foods that are hard to digest.  Avoid drinking alcohol for 24 hours or as instructed by your health care provider.  Only take over-the-counter or prescription medicines as directed by your health care provider.  If a tissue sample (biopsy) was taken during your procedure:  Do not take aspirin or blood thinners for 7 days, or as instructed by your health care provider.  Do not drink alcohol for 7 days, or as instructed by your health care provider.  Eat soft foods for the first 24 hours. SEEK MEDICAL CARE IF: You have persistent spotting of blood in your stool 2 3 days after the procedure. SEEK IMMEDIATE MEDICAL CARE IF:  You have more than a small spotting of blood in your stool.  You pass large blood clots in your stool.  Your abdomen is swollen (distended).  You have nausea or vomiting.  You have a fever.  You have increasing abdominal pain that is not relieved with medicine. Document Released: 01/09/2004 Document Revised: 03/17/2013 Document Reviewed: 02/01/2013 [facility] Patient Information 2014 [place].

## 2013-08-27 NOTE — Anesthesia Preprocedure Evaluation (Addendum)
Anesthesia Evaluation  Patient identified by MRN, date of birth, ID band Patient awake    Reviewed: Allergy & Precautions, H&P , NPO status , Patient's Chart, lab work & pertinent test results  Airway Mallampati: II TM Distance: >3 FB Neck ROM: Full    Dental no notable dental hx.    Pulmonary sleep apnea , former smoker,  Denies asthma breath sounds clear to auscultation  Pulmonary exam normal       Cardiovascular hypertension, Pt. on medications + CAD and + Peripheral Vascular Disease Rhythm:Regular Rate:Normal  Cardiology note from September 2014 Dr. Gwenlyn Found reviewed. EF 44%. Positive stress test. Cath to be performed again if chest pain persisted, but the chest pain did not persist.  Cath 2008 mild CAD.   Neuro/Psych negative neurological ROS  negative psych ROS   GI/Hepatic negative GI ROS, Neg liver ROS,   Endo/Other  negative endocrine ROS  Renal/GU Renal disease  negative genitourinary   Musculoskeletal  (+) Arthritis -, Osteoarthritis,    Abdominal   Peds negative pediatric ROS (+)  Hematology negative hematology ROS (+)   Anesthesia Other Findings   Reproductive/Obstetrics negative OB ROS                         Anesthesia Physical Anesthesia Plan  ASA: III  Anesthesia Plan: MAC   Post-op Pain Management:    Induction: Intravenous  Airway Management Planned:   Additional Equipment:   Intra-op Plan:   Post-operative Plan:   Informed Consent: I have reviewed the patients History and Physical, chart, labs and discussed the procedure including the risks, benefits and alternatives for the proposed anesthesia with the patient or authorized representative who has indicated his/her understanding and acceptance.   Dental advisory given  Plan Discussed with: CRNA  Anesthesia Plan Comments:         Anesthesia Quick Evaluation

## 2013-08-30 ENCOUNTER — Encounter (HOSPITAL_COMMUNITY): Payer: Self-pay | Admitting: Gastroenterology

## 2013-09-29 ENCOUNTER — Encounter: Payer: Self-pay | Admitting: Cardiology

## 2013-09-29 ENCOUNTER — Ambulatory Visit (INDEPENDENT_AMBULATORY_CARE_PROVIDER_SITE_OTHER): Payer: BC Managed Care – PPO | Admitting: Cardiology

## 2013-09-29 VITALS — BP 122/84 | HR 72 | Ht 68.0 in | Wt 260.7 lb

## 2013-09-29 DIAGNOSIS — I10 Essential (primary) hypertension: Secondary | ICD-10-CM

## 2013-09-29 DIAGNOSIS — G4730 Sleep apnea, unspecified: Secondary | ICD-10-CM

## 2013-09-29 DIAGNOSIS — I2510 Atherosclerotic heart disease of native coronary artery without angina pectoris: Secondary | ICD-10-CM

## 2013-09-29 DIAGNOSIS — E785 Hyperlipidemia, unspecified: Secondary | ICD-10-CM

## 2013-09-29 NOTE — Assessment & Plan Note (Signed)
Followed by Dr Maudie Mercury

## 2013-09-29 NOTE — Assessment & Plan Note (Signed)
40-50% LAD, low risk Myoview 6/14

## 2013-09-29 NOTE — Patient Instructions (Signed)
Your physician recommends that you schedule a follow-up appointment in:6 months with Dr Berry 

## 2013-09-29 NOTE — Progress Notes (Signed)
09/29/2013 Larry [name]   1941-03-07  [id number]  Primary Physicia Jani Gravel, MD Primary Cardiologist: Dr Gwenlyn Found  HPI:  The patient is a 73 year old, moderately overweight, widowed [place] American male, who retired after working at [employer] then the [employer]. He has a history of hypertension, hyperlipidemia, and obstructive sleep apnea no longer on CPAP. He had noncritical CAD by cath performed September 08, 2006, with a 40% to 50% stenosis in the mid LAD and normal LV function. Myoview done in June 2014 was low risk. Echo done in Oct 2014 showed normal LVF with AOv sclerosis and PA pressures of 45 mmHg. He is here today for a 6 month check up. He says  " I feel the best I have in 10 years". No chest pain or unusual DOE. He actually took himself off C-pap after he "learned how to breath correctly when I sleep". He now sleeps on his side as opposed to sleeping on his back.     Current Outpatient Prescriptions  Medication Sig Dispense Refill  . amLODipine (NORVASC) 5 MG tablet Take 5 mg by mouth daily with lunch.       Marland Kitchen aspirin EC 81 MG tablet Take 1 tablet (81 mg total) by mouth daily.  90 tablet  3  . Cholecalciferol (VITAMIN D3) 5000 UNITS CAPS Take 1 capsule by mouth daily.      . Coral Calcium 1000 (390 CA) MG TABS Take 1 tablet by mouth daily.        . Ferrous Sulfate (IRON) 325 (65 FE) MG TABS Take 65 mg by mouth daily.      . finasteride (PROSCAR) 5 MG tablet Take 5 mg by mouth daily.      . fluticasone (FLONASE) 50 MCG/ACT nasal spray Place 2 sprays into the nose daily.  1 g  6  . losartan-hydrochlorothiazide (HYZAAR) 100-12.5 MG per tablet Take 1 tablet by mouth daily with lunch.       . rosuvastatin (CRESTOR) 20 MG tablet Take 20 mg by mouth daily.       . sildenafil (VIAGRA) 100 MG tablet Take 100 mg by mouth daily as needed for erectile dysfunction.       . tamsulosin (FLOMAX) 0.4 MG CAPS Take 0.4 mg by mouth daily.      Marland Kitchen ZETIA 10 MG tablet Take 10 mg by mouth daily.        No  current facility-administered medications for this visit.    Allergies  Allergen Reactions  . Penicillins Itching and Rash    History   Social History  . Marital Status: Widowed    Spouse Name: N/A    Number of Children: N/A  . Years of Education: N/A   Occupational History  . Not on file.   Social History Main Topics  . Smoking status: Former Smoker -- 0.50 packs/day for 40 years    Types: Cigarettes    Quit date: 06/11/2003  . Smokeless tobacco: Never Used  . Alcohol Use: Yes     Comment: occasional  . Drug Use: No  . Sexual Activity: Not on file   Other Topics Concern  . Not on file   Social History Narrative  . No narrative on file     Review of Systems: General: negative for chills, fever, night sweats or weight changes.  Cardiovascular: negative for chest pain, dyspnea on exertion, edema, orthopnea, palpitations, paroxysmal nocturnal dyspnea or shortness of breath Dermatological: negative for rash Respiratory: negative for cough or wheezing  Urologic: negative for hematuria Abdominal: negative for nausea, vomiting, diarrhea, bright red blood per rectum, melena, or hematemesis Neurologic: negative for visual changes, syncope, or dizziness All other systems reviewed and are otherwise negative except as noted above.    Blood pressure 122/84, pulse 72, height 5\' 8"  (1.727 m), weight 260 lb 11.2 oz (118.253 kg).  General appearance: alert, cooperative, no distress and moderately obese Neck: no carotid bruit and no JVD Lungs: clear to auscultation bilaterally Heart: regular rate and rhythm, S1, S2 normal, no murmur, click, rub or gallop Abdomen: obese Extremities: no edema  EKG NSR  ASSESSMENT AND PLAN:   Coronary arteriosclerosis 40-50% LAD, low risk Myoview 6/14  Hyperlipidemia Followed by Dr Maudie Mercury  Hypertension Treated  Sleep apnea Off C-pap, no current symptoms of sleep apnea    PLAN  No changes, follow up with Dr Gwenlyn Found in 6 months.  Doreene Burke  [facility] 09/29/2013 4:31 PM

## 2013-09-29 NOTE — Assessment & Plan Note (Signed)
Treated

## 2013-09-29 NOTE — Assessment & Plan Note (Signed)
Off C-pap, no current symptoms of sleep apnea

## 2014-04-26 ENCOUNTER — Encounter: Payer: Self-pay | Admitting: Cardiovascular Disease

## 2014-04-26 ENCOUNTER — Ambulatory Visit (INDEPENDENT_AMBULATORY_CARE_PROVIDER_SITE_OTHER): Payer: BC Managed Care – PPO | Admitting: Cardiovascular Disease

## 2014-04-26 VITALS — BP 118/82 | HR 65 | Ht 69.0 in | Wt 272.0 lb

## 2014-04-26 DIAGNOSIS — I2510 Atherosclerotic heart disease of native coronary artery without angina pectoris: Secondary | ICD-10-CM

## 2014-04-26 DIAGNOSIS — E785 Hyperlipidemia, unspecified: Secondary | ICD-10-CM

## 2014-04-26 DIAGNOSIS — I10 Essential (primary) hypertension: Secondary | ICD-10-CM

## 2014-04-26 NOTE — Assessment & Plan Note (Signed)
History of noncritical CAD by cath which I performed 09/08/06 with 40-50% stenosis of the mid LAD and normal LV function. He did have a Myoview stress test performed 02/11/13 that revealed an ejection fraction of 44% with mild to moderate ischemia in the inferior wall. He has not had any symptoms since I saw him one year ago. We'll continue current medications.

## 2014-04-26 NOTE — Assessment & Plan Note (Signed)
Blood pressure today is 118/82. It is well controlled on losartan and hydrochlorothiazide and amlodipine. We'll continue current medications at current dosing

## 2014-04-26 NOTE — Assessment & Plan Note (Signed)
History of hyperlipidemia on rosuvastatin 20 mg and Zetia. This is followed by his primary care physician. We will continue his current medications

## 2014-04-26 NOTE — Patient Instructions (Signed)
Your physician wants you to follow-up in: 1 year with Dr Berry. You will receive a reminder letter in the mail two months in advance. If you don't receive a letter, please call our office to schedule the follow-up appointment.  

## 2014-04-26 NOTE — Progress Notes (Signed)
04/26/2014 [name]   Jun 07, 1941  [id number]  Primary Physician Jani Gravel, MD Primary Cardiologist: Lorretta Harp MD Renae Gloss  . HPI:  The patient is a 73 year old, moderately overweight, widowed [place] American male, father of 91, grandfather to 1 grandchild who I saw a year ago. He has a history of hypertension, hyperlipidemia, obstructive sleep apnea no longer on CPAP. He had noncritical CAD by cath performed September 08, 2006, with a 40% to 50% stenosis in the mid LAD and normal LV function. Dr. Maudie Mercury has recently checked his lipid profile.he had an episode of chest pain and was seen by Dr. Maudie Mercury who referred him back here for evaluation. He sought [facility] who ordered a Myoview stress test That showed mild to moderate anterior ischemia with an ejection fraction of 44% which is a new finding.since I saw him one year ago he has seen [employer] in the office/22/15. He has been asymptomatic since that time. He has gained 12 pounds since he was here 6 months ago and is aware of the importance of diet and exercise.  Current Outpatient Prescriptions  Medication Sig Dispense Refill  . amLODipine (NORVASC) 5 MG tablet Take 5 mg by mouth daily with lunch.     Marland Kitchen aspirin EC 81 MG tablet Take 1 tablet (81 mg total) by mouth daily. 90 tablet 3  . Cholecalciferol (VITAMIN D3) 5000 UNITS CAPS Take 1 capsule by mouth daily.    . Coral Calcium 1000 (390 CA) MG TABS Take 1 tablet by mouth daily.      . Ferrous Sulfate (IRON) 325 (65 FE) MG TABS Take 65 mg by mouth daily.    . finasteride (PROSCAR) 5 MG tablet Take 5 mg by mouth daily.    . fluticasone (FLONASE) 50 MCG/ACT nasal spray Place 2 sprays into the nose daily. 1 g 6  . losartan-hydrochlorothiazide (HYZAAR) 100-12.5 MG per tablet Take 1 tablet by mouth daily with lunch.     . rosuvastatin (CRESTOR) 20 MG tablet Take 20 mg by mouth daily.     . sildenafil (VIAGRA) 100 MG tablet Take 100 mg by mouth daily as needed  for erectile dysfunction.     . tamsulosin (FLOMAX) 0.4 MG CAPS Take 0.4 mg by mouth daily.    Marland Kitchen ZETIA 10 MG tablet Take 10 mg by mouth daily.      No current facility-administered medications for this visit.    Allergies  Allergen Reactions  . Penicillins Itching and Rash    History   Social History  . Marital Status: Widowed    Spouse Name: N/A    Number of Children: N/A  . Years of Education: N/A   Occupational History  . Not on file.   Social History Main Topics  . Smoking status: Former Smoker -- 0.50 packs/day for 40 years    Types: Cigarettes    Quit date: 06/11/2003  . Smokeless tobacco: Never Used  . Alcohol Use: Yes     Comment: occasional  . Drug Use: No  . Sexual Activity: Not on file   Other Topics Concern  . Not on file   Social History Narrative     Review of Systems: General: negative for chills, fever, night sweats or weight changes.  Cardiovascular: negative for chest pain, dyspnea on exertion, edema, orthopnea, palpitations, paroxysmal nocturnal dyspnea or shortness of breath Dermatological: negative for rash Respiratory: negative for cough or wheezing Urologic: negative for hematuria Abdominal: negative for nausea,  vomiting, diarrhea, bright red blood per rectum, melena, or hematemesis Neurologic: negative for visual changes, syncope, or dizziness All other systems reviewed and are otherwise negative except as noted above.    Blood pressure 118/82, pulse 65, height 5\' 9"  (1.753 m), weight 272 lb (123.378 kg).  General appearance: alert and no distress Neck: no adenopathy, no carotid bruit, no JVD, supple, symmetrical, trachea midline and thyroid not enlarged, symmetric, no tenderness/mass/nodules Lungs: clear to auscultation bilaterally Heart: regular rate and rhythm, S1, S2 normal, no murmur, click, rub or gallop Extremities: extremities normal, atraumatic, no cyanosis or edema  EKG normal sinus rhythm at 65 without ST or T-wave changes.  I personally reviewed this EKG  ASSESSMENT AND PLAN:   Hypertension Blood pressure today is 118/82. It is well controlled on losartan and hydrochlorothiazide and amlodipine. We'll continue current medications at current dosing  Hyperlipidemia History of hyperlipidemia on rosuvastatin 20 mg and Zetia. This is followed by his primary care physician. We will continue his current medications  Coronary arteriosclerosis History of noncritical CAD by cath which I performed 09/08/06 with 40-50% stenosis of the mid LAD and normal LV function. He did have a Myoview stress test performed 02/11/13 that revealed an ejection fraction of 44% with mild to moderate ischemia in the inferior wall. He has not had any symptoms since I saw him one year ago. We'll continue current medications.      Lorretta Harp MD FACP,FACC,FAHA, [facility] 04/26/2014 9:04 AM

## 2014-08-04 ENCOUNTER — Encounter: Payer: Self-pay | Admitting: Internal Medicine

## 2014-08-04 ENCOUNTER — Ambulatory Visit (INDEPENDENT_AMBULATORY_CARE_PROVIDER_SITE_OTHER)
Admission: RE | Admit: 2014-08-04 | Discharge: 2014-08-04 | Disposition: A | Discharge status: Home / Self Care | Payer: BLUE CROSS/BLUE SHIELD | Source: Ambulatory Visit | Attending: Internal Medicine | Admitting: Internal Medicine

## 2014-08-04 ENCOUNTER — Ambulatory Visit (INDEPENDENT_AMBULATORY_CARE_PROVIDER_SITE_OTHER): Payer: BLUE CROSS/BLUE SHIELD | Admitting: Internal Medicine

## 2014-08-04 VITALS — BP 148/72 | HR 58 | Ht 68.0 in | Wt 272.4 lb

## 2014-08-04 DIAGNOSIS — J3089 Other allergic rhinitis: Secondary | ICD-10-CM

## 2014-08-04 DIAGNOSIS — R079 Chest pain, unspecified: Secondary | ICD-10-CM

## 2014-08-04 DIAGNOSIS — J4520 Mild intermittent asthma, uncomplicated: Secondary | ICD-10-CM

## 2014-08-04 DIAGNOSIS — J309 Allergic rhinitis, unspecified: Secondary | ICD-10-CM

## 2014-08-04 NOTE — Assessment & Plan Note (Signed)
Chest pain has more musculoskeletal features at this point though the fact that is so diffuse and symmetric makes it less likely that he pulled a muscle doing his new exercises, but  it is resolving typically like a muscle strain and is better supine typical of mspc or gas pain but does not suggest a pulmonary or cardiac source at this point   Referred back to Dr Gwenlyn Found - described the type of pain we see in pulmonary disorders and f/u here prn

## 2014-08-04 NOTE — Progress Notes (Signed)
Subjective:     Patient ID: Larry [name], male   DOB: 09-21-1940      MRN: [id number]  HPI  38 yobm quit smoking 2005 started new exercise with sit up several days before onset of gen chest discomfort early Feb 2016 and despite actively being followed by cardiology for recurrent cp referred himself to pulmonary 08/04/2014    08/04/2014 1st [facility] office visit/ Neenah Canter   Chief Complaint  Patient presents with  . Pulmonary Consult    Self referral. Former pt of Dr Maren Reamer- last seen here in June 2012. He c/o chest discomfort x 2 wks- started when doing sit ups and is starting to improve. He states that 5 days ago he had some coughing and sneezing but this has totally resolved. He states "I guess my chest just feels numb".   pain resolves supine/ has no pleuritic pain, sym = bilaterally, positional, did not try nsaids,  has good ex tolerance which doesn't seem to provoke the pain or any assoc diaphoresis, nausea Says has had cp's before and eval in [place] [facility] then and by Dr young in 2012 and notes indicate mscp vs ibs > referred back to cards    Says uses saba maybe once or twice a week for his breathing and he thinks it helps. Uses flonase for nasal congestion prn   No obvious day to day or daytime variabilty or assoc  Excess or purulent mucus or   subjective wheeze or overt  hb symptoms. No unusual exp hx or h/o childhood pna/ asthma or knowledge of premature birth.  Sleeping ok without nocturnal  or early am exacerbation  of respiratory  c/o's or need for noct saba. Also denies any obvious fluctuation of symptoms with weather or environmental changes or other aggravating or alleviating factors except as outlined above   Current Medications, Allergies, Complete Past Medical History, Past Surgical History, Family History, and Social History were reviewed in [employer] record.  ROS  The following are not active complaints unless bolded sore throat,  dysphagia, dental problems, itching, sneezing,  nasal congestion or excess/ purulent secretions, ear ache,   fever, chills, sweats, unintended wt loss, classically pleuritic or exertional cp, hemoptysis,  orthopnea pnd or leg swelling, presyncope, palpitations, heartburn, abdominal pain, anorexia, nausea, vomiting, diarrhea  or change in bowel or urinary habits, change in stools or urine, dysuria,hematuria,  rash, arthralgias, visual complaints, headache, numbness weakness or ataxia or problems with walking or coordination,  change in mood/affect or memory.           Review of Systems     Objective:   Physical Exam Wt Readings from Last 3 Encounters:  08/04/14 272 lb 6.4 oz (123.56 kg)  04/26/14 272 lb (123.378 kg)  09/29/13 260 lb 11.2 oz (118.253 kg)    amb bm with freq throat clearing /Vital signs reviewed   HEENT: nl dentition, turbinates, and orophanx. Nl external ear canals without cough reflex   NECK :  without JVD/Nodes/TM/ nl carotid upstrokes bilaterally   LUNGS: no acc muscle use, clear to A and P bilaterally without cough on insp or exp maneuvers   CV:  RRR  no s3 or murmur or increase in P2, no edema   ABD:  soft and nontender with nl excursion in the supine position. No bruits or organomegaly, bowel sounds nl  MS:  warm without deformities, calf tenderness, cyanosis or clubbing  SKIN: warm and dry without lesions    NEURO:  alert, approp, no deficits    CXR PA and Lateral:   08/04/2014 :     I personally reviewed images and agree with radiology impression as follows:    COPD. There is no pneumonia nor CHF nor other acute cardiopulmonary abnormality.    Assessment:

## 2014-08-04 NOTE — Assessment & Plan Note (Signed)
All goals of chronic asthma control met including optimal function and elimination of symptoms with minimal need for rescue therapy.  Contingencies discussed in full including contacting this office immediately if not controlling the symptoms using the rule of two's.    

## 2014-08-04 NOTE — Assessment & Plan Note (Signed)
Samples of nasonex given, reviewed how to use this vs otc flonase/ f/u with Dr Annamaria Boots prn

## 2014-08-04 NOTE — Patient Instructions (Signed)
Please remember to go to the x-ray department downstairs for your tests - we will call you with the results when they are available.  Let Dr Gwenlyn Found know in the future when you start having chest pains unless they are brought on/ made worse by coughing or breathing in which case we need to see you here right away.  Pulmonary follow up is as needed

## 2014-08-05 NOTE — Progress Notes (Signed)
Quick Note:  Spoke with pt and notified of results per Dr. Wert. Pt verbalized understanding and denied any questions.  ______ 

## 2015-01-23 ENCOUNTER — Other Ambulatory Visit: Payer: Self-pay | Admitting: Internal Medicine

## 2015-01-23 DIAGNOSIS — I10 Essential (primary) hypertension: Secondary | ICD-10-CM

## 2015-01-24 ENCOUNTER — Encounter: Payer: Self-pay | Admitting: Cardiovascular Disease

## 2015-02-02 ENCOUNTER — Ambulatory Visit
Admission: RE | Admit: 2015-02-02 | Discharge: 2015-02-02 | Disposition: A | Discharge status: Home / Self Care | Payer: Medicare Other | Source: Ambulatory Visit | Attending: Internal Medicine | Admitting: Internal Medicine

## 2015-02-02 DIAGNOSIS — I10 Essential (primary) hypertension: Secondary | ICD-10-CM

## 2015-04-26 ENCOUNTER — Ambulatory Visit: Payer: Medicare Other | Admitting: Cardiovascular Disease

## 2015-05-02 ENCOUNTER — Encounter: Payer: Self-pay | Admitting: Cardiovascular Disease

## 2015-05-02 ENCOUNTER — Ambulatory Visit (INDEPENDENT_AMBULATORY_CARE_PROVIDER_SITE_OTHER): Payer: Medicare Other | Admitting: Cardiovascular Disease

## 2015-05-02 VITALS — BP 136/86 | HR 59 | Ht 68.0 in | Wt 269.0 lb

## 2015-05-02 DIAGNOSIS — I2510 Atherosclerotic heart disease of native coronary artery without angina pectoris: Secondary | ICD-10-CM | POA: Diagnosis not present

## 2015-05-02 DIAGNOSIS — E785 Hyperlipidemia, unspecified: Secondary | ICD-10-CM

## 2015-05-02 DIAGNOSIS — I10 Essential (primary) hypertension: Secondary | ICD-10-CM

## 2015-05-02 NOTE — Assessment & Plan Note (Signed)
History of hypertension blood pressure measured today at 136/86. He is on amlodipine, losartan and heart for thiazide. Continue current meds at current dosing

## 2015-05-02 NOTE — Patient Instructions (Signed)

## 2015-05-02 NOTE — Assessment & Plan Note (Signed)
History of hyperlipidemia on Crestor and Zetia followed by his PCP

## 2015-05-02 NOTE — Assessment & Plan Note (Signed)
History of noncritical coronary artery disease by diagnostic coronary arteriography performed 09/08/06. He had 40% to 50% mid LAD stenosis with normal LV function.

## 2015-05-02 NOTE — Progress Notes (Signed)
05/02/2015 Larry [name]   1940-12-06  [id number]  Primary Physician Jani Gravel, MD Primary Cardiologist: Lorretta Harp MD Renae Gloss   HPI:  The patient is a 74 year old, moderately overweight, widowed [place] American male, father of 20, grandfather to 1 grandchild who I saw a year ago. He has a history of hypertension, hyperlipidemia, obstructive sleep apnea no longer on CPAP. He had noncritical CAD by cath performed September 08, 2006, with a 40% to 50% stenosis in the mid LAD and normal LV function. Dr. Maudie Mercury has recently checked his lipid profile.he had an episode of chest pain and was seen by Dr. Maudie Mercury who referred him back here for evaluation. He sought [facility] [employer] who ordered a Myoview stress test That showed mild to moderate anterior ischemia with an ejection fraction of 44% which is a new finding.since . Since I saw him he regularly remained clinically stable denying chest pain or shortness of breath.  Current Outpatient Prescriptions  Medication Sig Dispense Refill  . albuterol (PROVENTIL HFA;VENTOLIN HFA) 108 (90 BASE) MCG/ACT inhaler Inhale 2 puffs into the lungs every 6 (six) hours as needed for wheezing or shortness of breath.    Marland Kitchen amLODipine (NORVASC) 5 MG tablet Take 5 mg by mouth daily with lunch.     Marland Kitchen aspirin EC 81 MG tablet Take 1 tablet (81 mg total) by mouth daily. 90 tablet 3  . Cholecalciferol (VITAMIN D3) 5000 UNITS CAPS Take 1,000 Units by mouth daily.     . Coral Calcium 1000 (390 CA) MG TABS Take 1 tablet by mouth daily.      . Ferrous Sulfate (IRON) 325 (65 FE) MG TABS Take 65 mg by mouth daily.    . finasteride (PROSCAR) 5 MG tablet Take 5 mg by mouth daily.    . fluticasone (FLONASE) 50 MCG/ACT nasal spray Place 2 sprays into the nose daily. 1 g 6  . losartan-hydrochlorothiazide (HYZAAR) 100-12.5 MG per tablet Take 1 tablet by mouth daily with lunch.     . rosuvastatin (CRESTOR) 20 MG tablet Take 20 mg by mouth daily.     . sildenafil (VIAGRA)  100 MG tablet Take 100 mg by mouth daily as needed for erectile dysfunction.     . tamsulosin (FLOMAX) 0.4 MG CAPS Take 0.4 mg by mouth daily.    Marland Kitchen ZETIA 10 MG tablet Take 10 mg by mouth daily.      No current facility-administered medications for this visit.    Allergies  Allergen Reactions  . Penicillins Itching and Rash    Social History   Social History  . Marital Status: Widowed    Spouse Name: N/A  . Number of Children: N/A  . Years of Education: N/A   Occupational History  . Not on file.   Social History Main Topics  . Smoking status: Former Smoker -- 0.50 packs/day for 40 years    Types: Cigarettes    Quit date: 06/11/2003  . Smokeless tobacco: Never Used  . Alcohol Use: Yes     Comment: occasional  . Drug Use: No  . Sexual Activity: Not on file   Other Topics Concern  . Not on file   Social History Narrative     Review of Systems: General: negative for chills, fever, night sweats or weight changes.  Cardiovascular: negative for chest pain, dyspnea on exertion, edema, orthopnea, palpitations, paroxysmal nocturnal dyspnea or shortness of breath Dermatological: negative for rash Respiratory: negative for cough or wheezing Urologic: negative for  hematuria Abdominal: negative for nausea, vomiting, diarrhea, bright red blood per rectum, melena, or hematemesis Neurologic: negative for visual changes, syncope, or dizziness All other systems reviewed and are otherwise negative except as noted above.    Blood pressure 136/86, pulse 59, height 5\' 8"  (1.727 m), weight 269 lb (122.018 kg).  General appearance: alert and no distress Neck: no adenopathy, no carotid bruit, no JVD, supple, symmetrical, trachea midline and thyroid not enlarged, symmetric, no tenderness/mass/nodules Lungs: clear to auscultation bilaterally Heart: regular rate and rhythm, S1, S2 normal, no murmur, click, rub or gallop Extremities: extremities normal, atraumatic, no cyanosis or  edema  EKG sinus bradycardia at 59 without ST or T-wave changes. I personally reviewed this EKG  ASSESSMENT AND PLAN:   Hypertension History of hypertension blood pressure measured today at 136/86. He is on amlodipine, losartan and heart for thiazide. Continue current meds at current dosing  Hyperlipidemia History of hyperlipidemia on Crestor and Zetia followed by his PCP  Coronary arteriosclerosis History of noncritical coronary artery disease by diagnostic coronary arteriography performed 09/08/06. He had 40% to 50% mid LAD stenosis with normal LV function.      Lorretta Harp MD FACP,FACC,FAHA, [facility] 05/02/2015 11:10 AM

## 2016-05-14 ENCOUNTER — Ambulatory Visit (INDEPENDENT_AMBULATORY_CARE_PROVIDER_SITE_OTHER): Payer: Medicare Other | Admitting: Cardiovascular Disease

## 2016-05-14 ENCOUNTER — Encounter: Payer: Self-pay | Admitting: Cardiovascular Disease

## 2016-05-14 VITALS — BP 122/76 | HR 72 | Ht 68.0 in | Wt 267.0 lb

## 2016-05-14 DIAGNOSIS — I10 Essential (primary) hypertension: Secondary | ICD-10-CM

## 2016-05-14 DIAGNOSIS — E7800 Pure hypercholesterolemia, unspecified: Secondary | ICD-10-CM

## 2016-05-14 DIAGNOSIS — I2510 Atherosclerotic heart disease of native coronary artery without angina pectoris: Secondary | ICD-10-CM

## 2016-05-14 NOTE — Progress Notes (Signed)
05/14/2016 [name]   1941-01-08  [id number]  Primary Physician Jani Gravel, MD Primary Cardiologist: Lorretta Harp MD Larry [name]  HPI:   The patient is a 75 year old, moderately overweight, widowed [place] American male, father of 16, grandfather to 1 grandchild who I saw in the office 05/02/15. He has a history of hypertension, hyperlipidemia, obstructive sleep apnea no longer on CPAP. He had noncritical CAD by cath performed September 08, 2006, with a 40% to 50% stenosis in the mid LAD and normal LV function. Dr. Maudie Mercury has recently checked his lipid profile.he had an episode of chest pain and was seen by Dr. Maudie Mercury who referred him back here for evaluation. He saw Kerin Ransom PA-C who ordered a Myoview stress test That showed mild to moderate anterior ischemia with an ejection fraction of 44% which is a new finding.since . Since I saw him back in the office a year ago he has remained clinically stable denying chest pain or shortness of breath.   Current Outpatient Prescriptions  Medication Sig Dispense Refill  . albuterol (PROVENTIL HFA;VENTOLIN HFA) 108 (90 BASE) MCG/ACT inhaler Inhale 2 puffs into the lungs every 6 (six) hours as needed for wheezing or shortness of breath.    Marland Kitchen amLODipine (NORVASC) 5 MG tablet Take 5 mg by mouth daily with lunch.     Marland Kitchen aspirin EC 81 MG tablet Take 1 tablet (81 mg total) by mouth daily. 90 tablet 3  . Cholecalciferol (VITAMIN D3) 5000 UNITS CAPS Take 1,000 Units by mouth daily.     . Coral Calcium 1000 (390 CA) MG TABS Take 1 tablet by mouth daily.      . Ferrous Sulfate (IRON) 325 (65 FE) MG TABS Take 65 mg by mouth daily.    . finasteride (PROSCAR) 5 MG tablet Take 5 mg by mouth daily.    . fluticasone (FLONASE) 50 MCG/ACT nasal spray Place 2 sprays into the nose daily. 1 g 6  . losartan-hydrochlorothiazide (HYZAAR) 100-12.5 MG per tablet Take 1 tablet by mouth daily with lunch.     . rosuvastatin (CRESTOR) 20 MG tablet Take 20 mg by mouth  daily.     . sildenafil (VIAGRA) 100 MG tablet Take 100 mg by mouth daily as needed for erectile dysfunction.     . tamsulosin (FLOMAX) 0.4 MG CAPS Take 0.4 mg by mouth daily.    Marland Kitchen ZETIA 10 MG tablet Take 10 mg by mouth daily.      No current facility-administered medications for this visit.     Allergies  Allergen Reactions  . Penicillins Itching and Rash    Social History   Social History  . Marital status: Widowed    Spouse name: N/A  . Number of children: N/A  . Years of education: N/A   Occupational History  . Not on file.   Social History Main Topics  . Smoking status: Former Smoker    Packs/day: 0.50    Years: 40.00    Types: Cigarettes    Quit date: 06/11/2003  . Smokeless tobacco: Never Used  . Alcohol use Yes     Comment: occasional  . Drug use: No  . Sexual activity: Not on file   Other Topics Concern  . Not on file   Social History Narrative  . No narrative on file     Review of Systems: General: negative for chills, fever, night sweats or weight changes.  Cardiovascular: negative for chest pain, dyspnea on exertion,  edema, orthopnea, palpitations, paroxysmal nocturnal dyspnea or shortness of breath Dermatological: negative for rash Respiratory: negative for cough or wheezing Urologic: negative for hematuria Abdominal: negative for nausea, vomiting, diarrhea, bright red blood per rectum, melena, or hematemesis Neurologic: negative for visual changes, syncope, or dizziness All other systems reviewed and are otherwise negative except as noted above.    Blood pressure 122/76, pulse 72, height 5\' 8"  (1.727 m), weight 267 lb (121.1 kg).  General appearance: alert and no distress Neck: no adenopathy, no carotid bruit, no JVD, supple, symmetrical, trachea midline and thyroid not enlarged, symmetric, no tenderness/mass/nodules Lungs: clear to auscultation bilaterally Heart: regular rate and rhythm, S1, S2 normal, no murmur, click, rub or  gallop Extremities: extremities normal, atraumatic, no cyanosis or edema  EKG sinus rhythm at 72 ST or T-wave changes. I personally reviewed this EKG  ASSESSMENT AND PLAN:   Hypertension History of hypertension blood pressure 122/76. He is on amlodipine, losartan and hydrochlorothiazide. Continue current meds at current dosing  Hyperlipidemia History of hyperlipidemia on statin therapy followed by his PCP  Coronary arteriosclerosis History of noncritical CAD by cath 09/08/06 with 40 of 50% mid LAD stenosis and normal LV function. He denies chest pain or shortness of breath.      Lorretta Harp MD FACP,FACC,FAHA, [facility] 05/14/2016 10:47 AM

## 2016-05-14 NOTE — Assessment & Plan Note (Signed)
History of noncritical CAD by cath 09/08/06 with 40 of 50% mid LAD stenosis and normal LV function. He denies chest pain or shortness of breath.

## 2016-05-14 NOTE — Patient Instructions (Signed)

## 2016-05-14 NOTE — Assessment & Plan Note (Signed)
History of hypertension blood pressure 122/76. He is on amlodipine, losartan and hydrochlorothiazide. Continue current meds at current dosing

## 2016-05-14 NOTE — Assessment & Plan Note (Signed)
History of hyperlipidemia on statin therapy followed by his PCP 

## 2016-06-19 DIAGNOSIS — Z86010 Personal history of colonic polyps: Secondary | ICD-10-CM | POA: Diagnosis not present

## 2016-06-19 DIAGNOSIS — K5732 Diverticulitis of large intestine without perforation or abscess without bleeding: Secondary | ICD-10-CM | POA: Diagnosis not present

## 2016-06-20 DIAGNOSIS — Z87891 Personal history of nicotine dependence: Secondary | ICD-10-CM | POA: Diagnosis not present

## 2016-06-20 DIAGNOSIS — Z79899 Other long term (current) drug therapy: Secondary | ICD-10-CM | POA: Diagnosis not present

## 2016-06-20 DIAGNOSIS — Z0000 Encounter for general adult medical examination without abnormal findings: Secondary | ICD-10-CM | POA: Diagnosis not present

## 2016-08-15 DIAGNOSIS — Z125 Encounter for screening for malignant neoplasm of prostate: Secondary | ICD-10-CM | POA: Diagnosis not present

## 2016-08-15 DIAGNOSIS — I10 Essential (primary) hypertension: Secondary | ICD-10-CM | POA: Diagnosis not present

## 2016-08-15 DIAGNOSIS — R739 Hyperglycemia, unspecified: Secondary | ICD-10-CM | POA: Diagnosis not present

## 2016-08-21 DIAGNOSIS — E7800 Pure hypercholesterolemia, unspecified: Secondary | ICD-10-CM | POA: Diagnosis not present

## 2016-08-21 DIAGNOSIS — I10 Essential (primary) hypertension: Secondary | ICD-10-CM | POA: Diagnosis not present

## 2016-08-21 DIAGNOSIS — R739 Hyperglycemia, unspecified: Secondary | ICD-10-CM | POA: Diagnosis not present

## 2016-08-21 DIAGNOSIS — Z23 Encounter for immunization: Secondary | ICD-10-CM | POA: Diagnosis not present

## 2016-08-21 DIAGNOSIS — M25552 Pain in left hip: Secondary | ICD-10-CM | POA: Diagnosis not present

## 2016-09-23 ENCOUNTER — Telehealth: Payer: Self-pay | Admitting: Cardiovascular Disease

## 2016-09-23 NOTE — Telephone Encounter (Signed)
Requesting surgical clearance:  1. Type of surgery: Colonoscopy  2. Surgeon: Dr. Carol Ada  3.Surgical Date:  10/01/16  4. Medications that need to be held: ASA 81   5. CAD: Yes  6. I will defer to:  Dr. Pearla Dubonnet Information:  [facility] Phone:  [phone] Fax:  [phone]

## 2016-09-24 DIAGNOSIS — R51 Headache: Secondary | ICD-10-CM | POA: Diagnosis not present

## 2016-09-24 DIAGNOSIS — R531 Weakness: Secondary | ICD-10-CM | POA: Diagnosis not present

## 2016-09-24 DIAGNOSIS — G4733 Obstructive sleep apnea (adult) (pediatric): Secondary | ICD-10-CM | POA: Diagnosis not present

## 2016-09-24 DIAGNOSIS — K50019 Crohn's disease of small intestine with unspecified complications: Secondary | ICD-10-CM | POA: Diagnosis not present

## 2016-09-24 DIAGNOSIS — K5730 Diverticulosis of large intestine without perforation or abscess without bleeding: Secondary | ICD-10-CM | POA: Diagnosis not present

## 2016-09-24 DIAGNOSIS — I10 Essential (primary) hypertension: Secondary | ICD-10-CM | POA: Diagnosis not present

## 2016-09-24 DIAGNOSIS — K5000 Crohn's disease of small intestine without complications: Secondary | ICD-10-CM | POA: Diagnosis not present

## 2016-09-24 DIAGNOSIS — R918 Other nonspecific abnormal finding of lung field: Secondary | ICD-10-CM | POA: Diagnosis not present

## 2016-09-24 DIAGNOSIS — R079 Chest pain, unspecified: Secondary | ICD-10-CM | POA: Diagnosis not present

## 2016-09-24 DIAGNOSIS — N281 Cyst of kidney, acquired: Secondary | ICD-10-CM | POA: Diagnosis not present

## 2016-09-24 DIAGNOSIS — Z6841 Body Mass Index (BMI) 40.0 and over, adult: Secondary | ICD-10-CM | POA: Diagnosis not present

## 2016-09-24 DIAGNOSIS — E785 Hyperlipidemia, unspecified: Secondary | ICD-10-CM | POA: Diagnosis not present

## 2016-09-24 DIAGNOSIS — Z87891 Personal history of nicotine dependence: Secondary | ICD-10-CM | POA: Diagnosis not present

## 2016-09-24 DIAGNOSIS — K529 Noninfective gastroenteritis and colitis, unspecified: Secondary | ICD-10-CM | POA: Diagnosis not present

## 2016-09-24 DIAGNOSIS — R1031 Right lower quadrant pain: Secondary | ICD-10-CM | POA: Diagnosis not present

## 2016-09-24 DIAGNOSIS — R0789 Other chest pain: Secondary | ICD-10-CM | POA: Diagnosis not present

## 2016-09-24 DIAGNOSIS — I119 Hypertensive heart disease without heart failure: Secondary | ICD-10-CM | POA: Diagnosis not present

## 2016-09-24 DIAGNOSIS — I714 Abdominal aortic aneurysm, without rupture: Secondary | ICD-10-CM | POA: Diagnosis not present

## 2016-09-24 DIAGNOSIS — E669 Obesity, unspecified: Secondary | ICD-10-CM | POA: Diagnosis not present

## 2016-09-25 DIAGNOSIS — K529 Noninfective gastroenteritis and colitis, unspecified: Secondary | ICD-10-CM | POA: Diagnosis not present

## 2016-09-25 DIAGNOSIS — K50019 Crohn's disease of small intestine with unspecified complications: Secondary | ICD-10-CM | POA: Diagnosis not present

## 2016-09-25 DIAGNOSIS — K5000 Crohn's disease of small intestine without complications: Secondary | ICD-10-CM | POA: Diagnosis not present

## 2016-09-25 DIAGNOSIS — Z6841 Body Mass Index (BMI) 40.0 and over, adult: Secondary | ICD-10-CM | POA: Diagnosis not present

## 2016-09-25 DIAGNOSIS — E669 Obesity, unspecified: Secondary | ICD-10-CM | POA: Diagnosis not present

## 2016-09-25 DIAGNOSIS — E7800 Pure hypercholesterolemia, unspecified: Secondary | ICD-10-CM | POA: Diagnosis not present

## 2016-09-25 DIAGNOSIS — I119 Hypertensive heart disease without heart failure: Secondary | ICD-10-CM | POA: Diagnosis not present

## 2016-09-25 DIAGNOSIS — G4733 Obstructive sleep apnea (adult) (pediatric): Secondary | ICD-10-CM | POA: Diagnosis not present

## 2016-09-25 DIAGNOSIS — I10 Essential (primary) hypertension: Secondary | ICD-10-CM | POA: Diagnosis not present

## 2016-09-25 DIAGNOSIS — R0789 Other chest pain: Secondary | ICD-10-CM | POA: Diagnosis not present

## 2016-09-25 DIAGNOSIS — R079 Chest pain, unspecified: Secondary | ICD-10-CM | POA: Diagnosis not present

## 2016-09-25 DIAGNOSIS — I714 Abdominal aortic aneurysm, without rupture: Secondary | ICD-10-CM | POA: Diagnosis not present

## 2016-09-25 DIAGNOSIS — Z87891 Personal history of nicotine dependence: Secondary | ICD-10-CM | POA: Diagnosis not present

## 2016-09-25 DIAGNOSIS — E785 Hyperlipidemia, unspecified: Secondary | ICD-10-CM | POA: Diagnosis not present

## 2016-09-25 NOTE — Telephone Encounter (Signed)
Clearance routed to number provided via EPIC. 

## 2016-09-25 NOTE — Telephone Encounter (Signed)
Okay to hold aspirin for colonoscopy

## 2016-09-30 NOTE — Telephone Encounter (Signed)
Received fax stating they need documentation on clearance for General Anesthesia from Dr. Gwenlyn Found. Routing to Dr. Gwenlyn Found to advise.

## 2016-09-30 NOTE — Telephone Encounter (Signed)
Clear to colonoscopy at low risk. Can hold aspirin

## 2016-09-30 NOTE — Telephone Encounter (Signed)
Routed to number provided via EPIC. 

## 2016-10-02 DIAGNOSIS — K529 Noninfective gastroenteritis and colitis, unspecified: Secondary | ICD-10-CM | POA: Diagnosis not present

## 2016-10-02 DIAGNOSIS — R1032 Left lower quadrant pain: Secondary | ICD-10-CM | POA: Diagnosis not present

## 2016-10-02 DIAGNOSIS — R1031 Right lower quadrant pain: Secondary | ICD-10-CM | POA: Diagnosis not present

## 2016-10-02 DIAGNOSIS — R197 Diarrhea, unspecified: Secondary | ICD-10-CM | POA: Diagnosis not present

## 2016-10-10 ENCOUNTER — Encounter: Payer: Self-pay | Admitting: Cardiovascular Disease

## 2016-10-10 DIAGNOSIS — R933 Abnormal findings on diagnostic imaging of other parts of digestive tract: Secondary | ICD-10-CM | POA: Diagnosis not present

## 2016-10-10 DIAGNOSIS — K635 Polyp of colon: Secondary | ICD-10-CM | POA: Diagnosis not present

## 2016-10-10 DIAGNOSIS — K5730 Diverticulosis of large intestine without perforation or abscess without bleeding: Secondary | ICD-10-CM | POA: Diagnosis not present

## 2016-10-10 DIAGNOSIS — D120 Benign neoplasm of cecum: Secondary | ICD-10-CM | POA: Diagnosis not present

## 2016-10-21 DIAGNOSIS — R197 Diarrhea, unspecified: Secondary | ICD-10-CM | POA: Diagnosis not present

## 2016-10-21 DIAGNOSIS — R1033 Periumbilical pain: Secondary | ICD-10-CM | POA: Diagnosis not present

## 2016-10-21 DIAGNOSIS — K219 Gastro-esophageal reflux disease without esophagitis: Secondary | ICD-10-CM | POA: Diagnosis not present

## 2016-11-18 DIAGNOSIS — R197 Diarrhea, unspecified: Secondary | ICD-10-CM | POA: Diagnosis not present

## 2016-11-18 DIAGNOSIS — R1031 Right lower quadrant pain: Secondary | ICD-10-CM | POA: Diagnosis not present

## 2016-11-18 DIAGNOSIS — K529 Noninfective gastroenteritis and colitis, unspecified: Secondary | ICD-10-CM | POA: Diagnosis not present

## 2016-11-18 DIAGNOSIS — K219 Gastro-esophageal reflux disease without esophagitis: Secondary | ICD-10-CM | POA: Diagnosis not present

## 2017-02-17 DIAGNOSIS — Z5181 Encounter for therapeutic drug level monitoring: Secondary | ICD-10-CM | POA: Diagnosis not present

## 2017-02-17 DIAGNOSIS — I10 Essential (primary) hypertension: Secondary | ICD-10-CM | POA: Diagnosis not present

## 2017-02-17 DIAGNOSIS — M25551 Pain in right hip: Secondary | ICD-10-CM | POA: Diagnosis not present

## 2017-02-17 DIAGNOSIS — R739 Hyperglycemia, unspecified: Secondary | ICD-10-CM | POA: Diagnosis not present

## 2017-02-21 DIAGNOSIS — R079 Chest pain, unspecified: Secondary | ICD-10-CM | POA: Diagnosis not present

## 2017-02-21 DIAGNOSIS — E559 Vitamin D deficiency, unspecified: Secondary | ICD-10-CM | POA: Diagnosis not present

## 2017-02-21 DIAGNOSIS — M47812 Spondylosis without myelopathy or radiculopathy, cervical region: Secondary | ICD-10-CM | POA: Diagnosis not present

## 2017-02-21 DIAGNOSIS — I10 Essential (primary) hypertension: Secondary | ICD-10-CM | POA: Diagnosis not present

## 2017-02-21 DIAGNOSIS — R739 Hyperglycemia, unspecified: Secondary | ICD-10-CM | POA: Diagnosis not present

## 2017-02-21 DIAGNOSIS — M542 Cervicalgia: Secondary | ICD-10-CM | POA: Diagnosis not present

## 2017-02-21 DIAGNOSIS — M791 Myalgia: Secondary | ICD-10-CM | POA: Diagnosis not present

## 2017-09-03 DIAGNOSIS — I10 Essential (primary) hypertension: Secondary | ICD-10-CM | POA: Diagnosis not present

## 2017-09-03 DIAGNOSIS — Z809 Family history of malignant neoplasm, unspecified: Secondary | ICD-10-CM | POA: Diagnosis not present

## 2017-09-03 DIAGNOSIS — Z87891 Personal history of nicotine dependence: Secondary | ICD-10-CM | POA: Diagnosis not present

## 2017-09-03 DIAGNOSIS — E785 Hyperlipidemia, unspecified: Secondary | ICD-10-CM | POA: Diagnosis not present

## 2017-09-03 DIAGNOSIS — G8929 Other chronic pain: Secondary | ICD-10-CM | POA: Diagnosis not present

## 2017-09-03 DIAGNOSIS — N529 Male erectile dysfunction, unspecified: Secondary | ICD-10-CM | POA: Diagnosis not present

## 2017-09-03 DIAGNOSIS — Z6841 Body Mass Index (BMI) 40.0 and over, adult: Secondary | ICD-10-CM | POA: Diagnosis not present

## 2017-09-03 DIAGNOSIS — N400 Enlarged prostate without lower urinary tract symptoms: Secondary | ICD-10-CM | POA: Diagnosis not present

## 2017-09-03 DIAGNOSIS — Z8249 Family history of ischemic heart disease and other diseases of the circulatory system: Secondary | ICD-10-CM | POA: Diagnosis not present

## 2017-09-08 DIAGNOSIS — K219 Gastro-esophageal reflux disease without esophagitis: Secondary | ICD-10-CM | POA: Diagnosis not present

## 2017-09-08 DIAGNOSIS — Z125 Encounter for screening for malignant neoplasm of prostate: Secondary | ICD-10-CM | POA: Diagnosis not present

## 2017-09-08 DIAGNOSIS — M25569 Pain in unspecified knee: Secondary | ICD-10-CM | POA: Diagnosis not present

## 2017-09-08 DIAGNOSIS — I10 Essential (primary) hypertension: Secondary | ICD-10-CM | POA: Diagnosis not present

## 2017-09-08 DIAGNOSIS — G8929 Other chronic pain: Secondary | ICD-10-CM | POA: Diagnosis not present

## 2017-09-08 DIAGNOSIS — R69 Illness, unspecified: Secondary | ICD-10-CM | POA: Diagnosis not present

## 2017-09-08 DIAGNOSIS — E7800 Pure hypercholesterolemia, unspecified: Secondary | ICD-10-CM | POA: Diagnosis not present

## 2017-09-08 DIAGNOSIS — Z79899 Other long term (current) drug therapy: Secondary | ICD-10-CM | POA: Diagnosis not present

## 2017-11-17 DIAGNOSIS — Z125 Encounter for screening for malignant neoplasm of prostate: Secondary | ICD-10-CM | POA: Diagnosis not present

## 2017-11-17 DIAGNOSIS — R69 Illness, unspecified: Secondary | ICD-10-CM | POA: Diagnosis not present

## 2017-11-17 DIAGNOSIS — I10 Essential (primary) hypertension: Secondary | ICD-10-CM | POA: Diagnosis not present

## 2017-11-25 DIAGNOSIS — R718 Other abnormality of red blood cells: Secondary | ICD-10-CM | POA: Diagnosis not present

## 2017-11-25 DIAGNOSIS — K219 Gastro-esophageal reflux disease without esophagitis: Secondary | ICD-10-CM | POA: Diagnosis not present

## 2017-11-25 DIAGNOSIS — Z79899 Other long term (current) drug therapy: Secondary | ICD-10-CM | POA: Diagnosis not present

## 2017-11-25 DIAGNOSIS — R739 Hyperglycemia, unspecified: Secondary | ICD-10-CM | POA: Diagnosis not present

## 2017-11-25 DIAGNOSIS — Z0000 Encounter for general adult medical examination without abnormal findings: Secondary | ICD-10-CM | POA: Diagnosis not present

## 2017-11-25 DIAGNOSIS — E7800 Pure hypercholesterolemia, unspecified: Secondary | ICD-10-CM | POA: Diagnosis not present

## 2017-12-24 DIAGNOSIS — K219 Gastro-esophageal reflux disease without esophagitis: Secondary | ICD-10-CM | POA: Diagnosis not present

## 2018-03-20 DIAGNOSIS — Z23 Encounter for immunization: Secondary | ICD-10-CM | POA: Diagnosis not present

## 2018-05-18 DIAGNOSIS — E7800 Pure hypercholesterolemia, unspecified: Secondary | ICD-10-CM | POA: Diagnosis not present

## 2018-05-18 DIAGNOSIS — Z0000 Encounter for general adult medical examination without abnormal findings: Secondary | ICD-10-CM | POA: Diagnosis not present

## 2018-05-18 DIAGNOSIS — R718 Other abnormality of red blood cells: Secondary | ICD-10-CM | POA: Diagnosis not present

## 2018-05-18 DIAGNOSIS — R739 Hyperglycemia, unspecified: Secondary | ICD-10-CM | POA: Diagnosis not present

## 2018-05-22 DIAGNOSIS — M25512 Pain in left shoulder: Secondary | ICD-10-CM | POA: Diagnosis not present

## 2018-05-22 DIAGNOSIS — G8929 Other chronic pain: Secondary | ICD-10-CM | POA: Diagnosis not present

## 2018-05-22 DIAGNOSIS — J069 Acute upper respiratory infection, unspecified: Secondary | ICD-10-CM | POA: Diagnosis not present

## 2018-05-25 DIAGNOSIS — H6120 Impacted cerumen, unspecified ear: Secondary | ICD-10-CM | POA: Diagnosis not present

## 2018-05-25 DIAGNOSIS — J0100 Acute maxillary sinusitis, unspecified: Secondary | ICD-10-CM | POA: Diagnosis not present

## 2018-05-25 DIAGNOSIS — I10 Essential (primary) hypertension: Secondary | ICD-10-CM | POA: Diagnosis not present

## 2018-05-25 DIAGNOSIS — E7800 Pure hypercholesterolemia, unspecified: Secondary | ICD-10-CM | POA: Diagnosis not present

## 2018-05-25 DIAGNOSIS — G8929 Other chronic pain: Secondary | ICD-10-CM | POA: Diagnosis not present

## 2018-08-15 DIAGNOSIS — J019 Acute sinusitis, unspecified: Secondary | ICD-10-CM | POA: Diagnosis not present

## 2018-08-15 DIAGNOSIS — J209 Acute bronchitis, unspecified: Secondary | ICD-10-CM | POA: Diagnosis not present

## 2018-10-03 DIAGNOSIS — M25511 Pain in right shoulder: Secondary | ICD-10-CM | POA: Diagnosis not present

## 2018-10-09 DIAGNOSIS — M25511 Pain in right shoulder: Secondary | ICD-10-CM | POA: Diagnosis not present

## 2018-10-09 DIAGNOSIS — M542 Cervicalgia: Secondary | ICD-10-CM | POA: Diagnosis not present

## 2018-10-09 DIAGNOSIS — M7541 Impingement syndrome of right shoulder: Secondary | ICD-10-CM | POA: Diagnosis not present

## 2018-10-26 DIAGNOSIS — Z888 Allergy status to other drugs, medicaments and biological substances status: Secondary | ICD-10-CM | POA: Diagnosis not present

## 2018-10-26 DIAGNOSIS — R1011 Right upper quadrant pain: Secondary | ICD-10-CM | POA: Diagnosis not present

## 2018-10-26 DIAGNOSIS — I10 Essential (primary) hypertension: Secondary | ICD-10-CM | POA: Diagnosis not present

## 2018-10-26 DIAGNOSIS — K5900 Constipation, unspecified: Secondary | ICD-10-CM | POA: Diagnosis not present

## 2018-10-26 DIAGNOSIS — Z880 Allergy status to penicillin: Secondary | ICD-10-CM | POA: Diagnosis not present

## 2018-10-26 DIAGNOSIS — K5730 Diverticulosis of large intestine without perforation or abscess without bleeding: Secondary | ICD-10-CM | POA: Diagnosis not present

## 2018-10-26 DIAGNOSIS — E785 Hyperlipidemia, unspecified: Secondary | ICD-10-CM | POA: Diagnosis not present

## 2018-10-26 DIAGNOSIS — Z7982 Long term (current) use of aspirin: Secondary | ICD-10-CM | POA: Diagnosis not present

## 2018-10-26 DIAGNOSIS — Z79899 Other long term (current) drug therapy: Secondary | ICD-10-CM | POA: Diagnosis not present

## 2018-10-29 ENCOUNTER — Institutional Professional Consult (permissible substitution): Payer: Medicare Other | Admitting: Internal Medicine

## 2018-11-11 DIAGNOSIS — R1011 Right upper quadrant pain: Secondary | ICD-10-CM | POA: Diagnosis not present

## 2018-11-11 DIAGNOSIS — R1013 Epigastric pain: Secondary | ICD-10-CM | POA: Diagnosis not present

## 2018-11-11 DIAGNOSIS — K219 Gastro-esophageal reflux disease without esophagitis: Secondary | ICD-10-CM | POA: Diagnosis not present

## 2018-11-11 DIAGNOSIS — R1012 Left upper quadrant pain: Secondary | ICD-10-CM | POA: Diagnosis not present

## 2018-11-12 ENCOUNTER — Telehealth: Payer: Self-pay | Admitting: Cardiovascular Disease

## 2018-11-12 NOTE — Telephone Encounter (Signed)
    [facility] Pre-operative Risk Assessment    Request for surgical clearance:  1. What type of surgery is being performed? Endoscopy with propofol   2. When is this surgery scheduled? November 19, 2018   3. What type of clearance is required (medical clearance vs. Pharmacy clearance to hold med vs. Both)? Medical   4. Are there any medications that need to be held prior to surgery and how long? None specified   5. Practice name and name of physician performing surgery? [facility] (either Dr. Juanita Craver or Dr. Carol Ada - form did not specify)   6. What is your office phone number [phone]    7.   What is your office fax number [phone]  8.   Anesthesia type (None, local, MAC, general) ? Propofol    Larry [name] M 11/12/2018, 4:26 PM  _________________________________________________________________   (provider comments below)

## 2018-11-13 NOTE — Telephone Encounter (Signed)
Pt has not been sen in the office since Dec 2017.  His endoscopy is scheduled for June 11th.  He has done well since he saw [place] last, no unusual chest pain, no dyspnea, no hospitalizations or cardiac testing.  I will ask dr Gwenlyn Found about clearance since its been so long since his LOV and his procedure is next week.   Kerin Ransom PA-C 11/13/2018 10:41 AM

## 2018-11-16 NOTE — Telephone Encounter (Signed)
Cleared for endoscopy

## 2018-11-16 NOTE — Telephone Encounter (Signed)
   Primary Cardiologist: Quay Burow, MD  Chart reviewed as part of pre-operative protocol coverage. Given past medical history and time since last visit, based on ACC/AHA guidelines, Marin Milley would be at acceptable risk for the planned procedure without further cardiovascular testing. He has been cleared by Dr. Gwenlyn Found, primary cardiologist.   I will route this recommendation to the requesting party via [place] fax function and remove from pre-op pool.  Please call with questions.  Lyda Jester, PA-C 11/16/2018, 9:45 AM

## 2018-11-19 DIAGNOSIS — K319 Disease of stomach and duodenum, unspecified: Secondary | ICD-10-CM | POA: Diagnosis not present

## 2018-11-19 DIAGNOSIS — R1013 Epigastric pain: Secondary | ICD-10-CM | POA: Diagnosis not present

## 2018-11-19 DIAGNOSIS — K2980 Duodenitis without bleeding: Secondary | ICD-10-CM | POA: Diagnosis not present

## 2018-11-19 DIAGNOSIS — K2970 Gastritis, unspecified, without bleeding: Secondary | ICD-10-CM | POA: Diagnosis not present

## 2018-11-26 DIAGNOSIS — E7800 Pure hypercholesterolemia, unspecified: Secondary | ICD-10-CM | POA: Diagnosis not present

## 2018-11-26 DIAGNOSIS — Z0000 Encounter for general adult medical examination without abnormal findings: Secondary | ICD-10-CM | POA: Diagnosis not present

## 2018-11-26 DIAGNOSIS — R739 Hyperglycemia, unspecified: Secondary | ICD-10-CM | POA: Diagnosis not present

## 2018-11-26 DIAGNOSIS — E559 Vitamin D deficiency, unspecified: Secondary | ICD-10-CM | POA: Diagnosis not present

## 2018-11-26 DIAGNOSIS — I10 Essential (primary) hypertension: Secondary | ICD-10-CM | POA: Diagnosis not present

## 2018-11-26 DIAGNOSIS — Z79899 Other long term (current) drug therapy: Secondary | ICD-10-CM | POA: Diagnosis not present

## 2018-12-01 DIAGNOSIS — Z0000 Encounter for general adult medical examination without abnormal findings: Secondary | ICD-10-CM | POA: Diagnosis not present

## 2018-12-01 DIAGNOSIS — R3915 Urgency of urination: Secondary | ICD-10-CM | POA: Diagnosis not present

## 2018-12-01 DIAGNOSIS — E7800 Pure hypercholesterolemia, unspecified: Secondary | ICD-10-CM | POA: Diagnosis not present

## 2018-12-01 DIAGNOSIS — K219 Gastro-esophageal reflux disease without esophagitis: Secondary | ICD-10-CM | POA: Diagnosis not present

## 2018-12-01 DIAGNOSIS — N179 Acute kidney failure, unspecified: Secondary | ICD-10-CM | POA: Diagnosis not present

## 2018-12-01 DIAGNOSIS — G629 Polyneuropathy, unspecified: Secondary | ICD-10-CM | POA: Diagnosis not present

## 2018-12-01 DIAGNOSIS — R739 Hyperglycemia, unspecified: Secondary | ICD-10-CM | POA: Diagnosis not present

## 2018-12-01 DIAGNOSIS — I10 Essential (primary) hypertension: Secondary | ICD-10-CM | POA: Diagnosis not present

## 2018-12-30 DIAGNOSIS — K219 Gastro-esophageal reflux disease without esophagitis: Secondary | ICD-10-CM | POA: Diagnosis not present

## 2019-03-19 DIAGNOSIS — Z23 Encounter for immunization: Secondary | ICD-10-CM | POA: Diagnosis not present

## 2019-05-26 DIAGNOSIS — I10 Essential (primary) hypertension: Secondary | ICD-10-CM | POA: Diagnosis not present

## 2019-05-26 DIAGNOSIS — E559 Vitamin D deficiency, unspecified: Secondary | ICD-10-CM | POA: Diagnosis not present

## 2019-05-26 DIAGNOSIS — Z0000 Encounter for general adult medical examination without abnormal findings: Secondary | ICD-10-CM | POA: Diagnosis not present

## 2019-05-26 DIAGNOSIS — E785 Hyperlipidemia, unspecified: Secondary | ICD-10-CM | POA: Diagnosis not present

## 2019-05-26 DIAGNOSIS — R739 Hyperglycemia, unspecified: Secondary | ICD-10-CM | POA: Diagnosis not present

## 2019-06-02 DIAGNOSIS — M898X9 Other specified disorders of bone, unspecified site: Secondary | ICD-10-CM | POA: Diagnosis not present

## 2019-06-02 DIAGNOSIS — I10 Essential (primary) hypertension: Secondary | ICD-10-CM | POA: Diagnosis not present

## 2019-06-02 DIAGNOSIS — R05 Cough: Secondary | ICD-10-CM | POA: Diagnosis not present

## 2019-06-02 DIAGNOSIS — R739 Hyperglycemia, unspecified: Secondary | ICD-10-CM | POA: Diagnosis not present

## 2019-06-02 DIAGNOSIS — E7800 Pure hypercholesterolemia, unspecified: Secondary | ICD-10-CM | POA: Diagnosis not present

## 2020-04-18 ENCOUNTER — Encounter: Payer: Self-pay | Admitting: Cardiovascular Disease

## 2020-04-18 ENCOUNTER — Ambulatory Visit (INDEPENDENT_AMBULATORY_CARE_PROVIDER_SITE_OTHER): Payer: Medicare HMO | Admitting: Cardiovascular Disease

## 2020-04-18 ENCOUNTER — Other Ambulatory Visit: Payer: Self-pay

## 2020-04-18 VITALS — BP 118/74 | HR 67 | Ht 68.0 in | Wt 246.0 lb

## 2020-04-18 DIAGNOSIS — I2510 Atherosclerotic heart disease of native coronary artery without angina pectoris: Secondary | ICD-10-CM

## 2020-04-18 DIAGNOSIS — I10 Essential (primary) hypertension: Secondary | ICD-10-CM

## 2020-04-18 DIAGNOSIS — E7800 Pure hypercholesterolemia, unspecified: Secondary | ICD-10-CM | POA: Diagnosis not present

## 2020-04-18 NOTE — Progress Notes (Signed)
04/18/2020 Larry [name]   1941-02-17  [id number]  Primary Physician Jani Gravel, MD Primary Cardiologist: Lorretta Harp MD FACP, [place], [place], [place]  HPI:  Larry [name] is a 79 y.o.  moderately overweight, widowed [place] American male, father of 19, grandfather to 1 grandchild who I saw in the office 05/14/2016. He has a history of hypertension, hyperlipidemia, obstructive sleep apnea no longer on CPAP. He had noncritical CAD by cath performed September 08, 2006, with a 40% to 50% stenosis in the mid LAD and normal LV function. Dr. Maudie Mercury has recently checked his lipid profile.he had an episode of chest pain and was seen by Dr. Maudie Mercury who referred him back here for evaluation. He saw Kerin Ransom PA-C who ordered a Myoview stress test That showed mild to moderate anterior ischemia with an ejection fraction of 44% which is a new finding.since . Since I saw him back in the office a year ago he has remained clinically stable denying chest pain or shortness of breath.   Current Meds  Medication Sig  . amLODipine (NORVASC) 5 MG tablet Take 5 mg by mouth daily with lunch.   . Cholecalciferol (VITAMIN D3) 5000 UNITS CAPS Take 1,000 Units by mouth daily.   . Ferrous Sulfate (IRON) 325 (65 FE) MG TABS Take 65 mg by mouth daily.  . finasteride (PROSCAR) 5 MG tablet Take 5 mg by mouth daily.  . fluticasone (FLONASE) 50 MCG/ACT nasal spray Place 2 sprays into the nose daily. (Patient taking differently: Place 2 sprays into the nose as needed. )  . losartan-hydrochlorothiazide (HYZAAR) 100-12.5 MG per tablet Take 1 tablet by mouth daily with lunch.   . rosuvastatin (CRESTOR) 20 MG tablet Take 20 mg by mouth daily.   . sildenafil (VIAGRA) 100 MG tablet Take 100 mg by mouth daily as needed for erectile dysfunction.   . tamsulosin (FLOMAX) 0.4 MG CAPS Take 0.4 mg by mouth daily.  . [DISCONTINUED] aspirin EC 81 MG tablet Take 1 tablet (81 mg total) by mouth daily.  . [DISCONTINUED] Coral Calcium 1000 (390 CA) MG  TABS Take 1 tablet by mouth daily.    . [DISCONTINUED] ZETIA 10 MG tablet Take 10 mg by mouth daily.      Allergies  Allergen Reactions  . Penicillins Itching and Rash    Social History   Socioeconomic History  . Marital status: Widowed    Spouse name: Not on file  . Number of children: Not on file  . Years of education: Not on file  . Highest education level: Not on file  Occupational History  . Not on file  Tobacco Use  . Smoking status: Former Smoker    Packs/day: 0.50    Years: 40.00    Pack years: 20.00    Types: Cigarettes    Quit date: 06/11/2003    Years since quitting: 16.8  . Smokeless tobacco: Never Used  Substance and Sexual Activity  . Alcohol use: Yes    Comment: occasional  . Drug use: No  . Sexual activity: Not on file  Other Topics Concern  . Not on file  Social History Narrative  . Not on file   Social Determinants of Health   Financial Resource Strain:   . Difficulty of Paying Living Expenses: Not on file  Food Insecurity:   . Worried About Charity fundraiser in the Last Year: Not on file  . Ran Out of Food in the Last Year: Not on file  Transportation Needs:   . Film/video editor (Medical): Not on file  . Lack of Transportation (Non-Medical): Not on file  Physical Activity:   . Days of Exercise per Week: Not on file  . Minutes of Exercise per Session: Not on file  Stress:   . Feeling of Stress : Not on file  Social Connections:   . Frequency of Communication with Friends and Family: Not on file  . Frequency of Social Gatherings with Friends and Family: Not on file  . Attends Religious Services: Not on file  . Active Member of Clubs or Organizations: Not on file  . Attends Archivist Meetings: Not on file  . Marital Status: Not on file  Intimate Partner Violence:   . Fear of Current or Ex-Partner: Not on file  . Emotionally Abused: Not on file  . Physically Abused: Not on file  . Sexually Abused: Not on file      Review of Systems: General: negative for chills, fever, night sweats or weight changes.  Cardiovascular: negative for chest pain, dyspnea on exertion, edema, orthopnea, palpitations, paroxysmal nocturnal dyspnea or shortness of breath Dermatological: negative for rash Respiratory: negative for cough or wheezing Urologic: negative for hematuria Abdominal: negative for nausea, vomiting, diarrhea, bright red blood per rectum, melena, or hematemesis Neurologic: negative for visual changes, syncope, or dizziness All other systems reviewed and are otherwise negative except as noted above.    Blood pressure 118/74, pulse 67, height 5\' 8"  (1.727 m), weight 246 lb (111.6 kg).  General appearance: alert and no distress Neck: no adenopathy, no carotid bruit, no JVD, supple, symmetrical, trachea midline and thyroid not enlarged, symmetric, no tenderness/mass/nodules Lungs: clear to auscultation bilaterally Heart: regular rate and rhythm, S1, S2 normal, no murmur, click, rub or gallop Extremities: extremities normal, atraumatic, no cyanosis or edema Pulses: 2+ and symmetric Skin: Skin color, texture, turgor normal. No rashes or lesions Neurologic: Alert and oriented X 3, normal strength and tone. Normal symmetric reflexes. Normal coordination and gait  EKG sinus rhythm at 67 without ST or T wave changes.  I personally reviewed this EKG.  ASSESSMENT AND PLAN:   Hypertension History of essential hypertension a blood pressure measured today 118/74.  He is on amlodipine, losartan and hydrochlorothiazide.  Hyperlipidemia History of hyperlipidemia on rosuvastatin in the past, currently on pravastatin both of which cause arthralgias.  His most recent LDL performed 01/12/2020 was 117, not at goal for secondary prevention.  I believe he would benefit from being on Repatha.  Coronary arteriosclerosis History of CAD status post cardiac catheterization by myself 09/08/2006 revealing a 40 to 50% stenosis in  the mid LAD with normal LV function.  His last Myoview performed 02/11/2013 did show inferior ischemia but he has been asymptomatic since.  He denies chest pain or shortness of breath.      Lorretta Harp MD FACP,FACC,FAHA, [facility] 04/18/2020 2:09 PM

## 2020-04-18 NOTE — Assessment & Plan Note (Signed)
History of hyperlipidemia on rosuvastatin in the past, currently on pravastatin both of which cause arthralgias.  His most recent LDL performed 01/12/2020 was 117, not at goal for secondary prevention.  I believe he would benefit from being on Repatha.

## 2020-04-18 NOTE — Patient Instructions (Signed)
Medication Instructions:  Erasmo Downer from pharmacy in to talk about addition of repatha to your medications.  *If you need a refill on your cardiac medications before your next appointment, please call your pharmacy*  Follow-Up: At [facility], you and your health needs are our priority.  As part of our continuing mission to provide you with exceptional heart care, we have created designated Provider Care Teams.  These Care Teams include your primary Cardiologist (physician) and Advanced Practice Providers (APPs -  Physician Assistants and Nurse Practitioners) who all work together to provide you with the care you need, when you need it.  We recommend signing up for the patient portal called "MyChart".  Sign up information is provided on this After Visit Summary.  MyChart is used to connect with patients for Virtual Visits (Telemedicine).  Patients are able to view lab/test results, encounter notes, upcoming appointments, etc.  Non-urgent messages can be sent to your provider as well.   To learn more about what you can do with MyChart, go to [email or web address].    Your next appointment:   12 month(s)  The format for your next appointment:   In Person  Provider:   Dr. Adora Fridge

## 2020-04-18 NOTE — Assessment & Plan Note (Addendum)
History of CAD status post cardiac catheterization by myself 09/08/2006 revealing a 40 to 50% stenosis in the mid LAD with normal LV function.  His last Myoview performed 02/11/2013 did show inferior ischemia but he has been asymptomatic since.  He denies chest pain or shortness of breath.

## 2020-04-18 NOTE — Assessment & Plan Note (Signed)
History of essential hypertension a blood pressure measured today 118/74.  He is on amlodipine, losartan and hydrochlorothiazide.

## 2020-04-20 ENCOUNTER — Telehealth: Payer: Self-pay

## 2020-04-20 DIAGNOSIS — E7800 Pure hypercholesterolemia, unspecified: Secondary | ICD-10-CM

## 2020-04-20 MED ORDER — REPATHA SURECLICK 140 MG/ML ~~LOC~~ SOAJ: 140.0000 mg | SUBCUTANEOUS | 11 refills | Status: DC

## 2020-04-20 NOTE — Telephone Encounter (Signed)
Called and lmomed the pt stated that the repatha was approved, rx sent, pt instructed to call back if unaffordable and to complete fasting lipids after 4th dose, labs ordered

## 2020-07-05 ENCOUNTER — Other Ambulatory Visit: Payer: Self-pay

## 2020-07-05 DIAGNOSIS — E7800 Pure hypercholesterolemia, unspecified: Secondary | ICD-10-CM

## 2020-07-06 LAB — HEPATIC FUNCTION PANEL
ALT: 10 IU/L (ref 0–44)
AST: 16 IU/L (ref 0–40)
Albumin: 4.6 g/dL (ref 3.7–4.7)
Alkaline Phosphatase: 101 IU/L (ref 44–121)
Bilirubin Total: 0.3 mg/dL (ref 0.0–1.2)
Bilirubin, Direct: 0.12 mg/dL (ref 0.00–0.40)
Total Protein: 7.1 g/dL (ref 6.0–8.5)

## 2020-07-06 LAB — LIPID PANEL
Chol/HDL Ratio: 2.7 ratio (ref 0.0–5.0)
Cholesterol, Total: 141 mg/dL (ref 100–199)
HDL: 53 mg/dL (ref >39)
LDL Chol Calc (NIH): 76 mg/dL (ref 0–99)
Triglycerides: 56 mg/dL (ref 0–149)
VLDL Cholesterol Cal: 12 mg/dL (ref 5–40)

## 2020-08-17 ENCOUNTER — Telehealth: Payer: Self-pay | Admitting: Cardiovascular Disease

## 2020-08-17 NOTE — Telephone Encounter (Signed)
  Pt c/o medication issue:  1. Name of Medication: Evolocumab (REPATHA SURECLICK) 403 MG/ML SOAJ  2. How are you currently taking this medication (dosage and times per day)? As directed  3. Are you having a reaction (difficulty breathing--STAT)?  Lips and tongue swelling, joints and muscles hurt, rash  4. What is your medication issue? Patient is having the above symptoms when he takes the [place]. He thought it might get better but it is not. He is due for another dose tomorrow and would like some direction about what to do.

## 2020-08-17 NOTE — Telephone Encounter (Signed)
Spoke with patient - he notes that the muscle aches have been getting progressively worse and that after his last injection (on Feb 25) he had swelling of his lips and tongue.  Is due for next injection tomorrow.   Advised he not take any more Repatha and offered sample of Praluent or Nexletol to try instead.  Patient agreeable to sample of Nexletol.  We are currently out of samples and will call him next week when they are back in stock.  Patient agreeable with plan.

## 2020-08-21 NOTE — Telephone Encounter (Signed)
Called and lm for pt to pick up some nexletol samples

## 2020-08-31 ENCOUNTER — Telehealth: Payer: Self-pay | Admitting: Cardiovascular Disease

## 2020-08-31 NOTE — Telephone Encounter (Signed)
Pt updated and verbalized understanding.  

## 2020-08-31 NOTE — Telephone Encounter (Signed)
Pt c/o medication issue:  1. Name of Medication: Nexletol 180 mg  2. How are you currently taking this medication (dosage and times per day)? Patient took lst dose Tues 08/29/20  3. Are you having a reaction (difficulty breathing--STAT)?   4. What is your medication issue? 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 The pharmacist gave him some samples of this medication as an alternative

## 2020-08-31 NOTE — Telephone Encounter (Signed)
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  Please STOP nexletol therapy immediatly. Already tried Ezetimibe, Repatha, and Nexletol.  Will discuss option of new medication Leqvio (inclisiran) when able to prescribe.

## 2020-09-04 NOTE — Telephone Encounter (Signed)
-----   Message from [place], [place] sent at 08/21/2020 10:17 AM EDT ----- Pt shoulda received nexletol 2 weeks ago its time to check on how he tolerated so we may send rx or complete pa

## 2020-09-04 NOTE — Telephone Encounter (Signed)
This encounter was created in error - please disregard.

## 2020-11-14 ENCOUNTER — Other Ambulatory Visit: Payer: Self-pay | Admitting: Gastroenterology

## 2020-11-14 DIAGNOSIS — R110 Nausea: Secondary | ICD-10-CM

## 2020-11-14 DIAGNOSIS — R1011 Right upper quadrant pain: Secondary | ICD-10-CM

## 2020-12-01 ENCOUNTER — Ambulatory Visit
Admission: RE | Admit: 2020-12-01 | Discharge: 2020-12-01 | Disposition: A | Discharge status: Home / Self Care | Payer: Medicare HMO | Source: Ambulatory Visit | Attending: Gastroenterology | Admitting: Gastroenterology

## 2020-12-01 DIAGNOSIS — R110 Nausea: Secondary | ICD-10-CM

## 2020-12-01 DIAGNOSIS — R1011 Right upper quadrant pain: Secondary | ICD-10-CM

## 2021-04-17 ENCOUNTER — Ambulatory Visit: Payer: Medicare HMO | Admitting: Cardiovascular Disease

## 2021-04-30 ENCOUNTER — Telehealth: Payer: Self-pay | Admitting: Pharmacist

## 2021-04-30 NOTE — Telephone Encounter (Signed)
Pt on list to follow up with once Leqvio became available. Please clarify if he is taking rosuvastatin 20mg  daily that's on his med list as his most recent LDL was 76 in January.  He is previously intolerant to Repatha (allergic reaction), ezetimibe, and Nexlizet. Do not think Leqvio will be covered well with his insurance but can have him fill out the enrollment form to see if he is interested, otherwise would recommend statin re-challenge.

## 2021-04-30 NOTE — Telephone Encounter (Signed)
Lvm for pt to call back and discuss cholesterol

## 2021-05-31 ENCOUNTER — Telehealth: Payer: Self-pay | Admitting: Pharmacist

## 2021-05-31 DIAGNOSIS — I2510 Atherosclerotic heart disease of native coronary artery without angina pectoris: Secondary | ICD-10-CM

## 2021-05-31 DIAGNOSIS — E7800 Pure hypercholesterolemia, unspecified: Secondary | ICD-10-CM

## 2021-05-31 NOTE — Telephone Encounter (Signed)
Patient called to discuss cholesterol.  Has an allergy/intolerance to Nexletol, Repatha, and Zetia.  Currently on Crestor 20mg . Sees a physical therapist for neuropathy who told me that Crestor could make his neuropathy worse.  Is following a diet of vegetables, fruits, and lean proteins. Last lipid panel January 2022. Advised that rosuvastatin was not likely to make neuropathy worse and he should continue. Recommend to update lipid panel and then can make decision if rosuvastatin dose needed to be increased, decreased, or kept the same.

## 2021-06-06 LAB — LIPID PANEL
Chol/HDL Ratio: 3.2 ratio (ref 0.0–5.0)
Cholesterol, Total: 142 mg/dL (ref 100–199)
HDL: 45 mg/dL (ref >39)
LDL Chol Calc (NIH): 85 mg/dL (ref 0–99)
Triglycerides: 59 mg/dL (ref 0–149)
VLDL Cholesterol Cal: 12 mg/dL (ref 5–40)

## 2021-06-14 ENCOUNTER — Other Ambulatory Visit: Payer: Self-pay

## 2021-06-14 ENCOUNTER — Telehealth: Payer: Self-pay | Admitting: Cardiovascular Disease

## 2021-06-14 DIAGNOSIS — I10 Essential (primary) hypertension: Secondary | ICD-10-CM

## 2021-06-14 DIAGNOSIS — E7800 Pure hypercholesterolemia, unspecified: Secondary | ICD-10-CM

## 2021-06-14 MED ORDER — ROSUVASTATIN CALCIUM 40 MG PO TABS: 40.0000 mg | ORAL_TABLET | Freq: Every day | ORAL | 1 refills | Status: DC

## 2021-06-14 NOTE — Telephone Encounter (Signed)
Correction best number is [phone].  He does not use the [phone] number very often

## 2021-06-14 NOTE — Telephone Encounter (Signed)
Pt returning call to Beatrix Fetters about lab results   Best number [phone]

## 2021-06-14 NOTE — Telephone Encounter (Signed)
Called patient, advised of lab work. Updated RX to 40 mg, ordered lab work for 3 months.   FYI.  Thanks!

## 2021-09-04 ENCOUNTER — Other Ambulatory Visit: Payer: Self-pay | Admitting: Gastroenterology

## 2021-09-04 DIAGNOSIS — R634 Abnormal weight loss: Secondary | ICD-10-CM

## 2021-10-03 ENCOUNTER — Ambulatory Visit
Admission: RE | Admit: 2021-10-03 | Discharge: 2021-10-03 | Disposition: A | Discharge status: Home / Self Care | Payer: Medicare Other | Source: Ambulatory Visit | Attending: Gastroenterology | Admitting: Gastroenterology

## 2021-10-03 DIAGNOSIS — R634 Abnormal weight loss: Secondary | ICD-10-CM

## 2021-10-03 MED ORDER — IOPAMIDOL (ISOVUE-300) INJECTION 61%
100.0000 mL | Freq: Once | INTRAVENOUS | Status: AC | PRN
  Administered 2021-10-03: 100 mL via INTRAVENOUS

## 2021-10-08 ENCOUNTER — Other Ambulatory Visit: Payer: Self-pay | Admitting: Gastroenterology

## 2021-10-08 DIAGNOSIS — Z77018 Contact with and (suspected) exposure to other hazardous metals: Secondary | ICD-10-CM

## 2021-10-08 DIAGNOSIS — R9389 Abnormal findings on diagnostic imaging of other specified body structures: Secondary | ICD-10-CM

## 2021-10-08 DIAGNOSIS — K8689 Other specified diseases of pancreas: Secondary | ICD-10-CM

## 2021-10-15 ENCOUNTER — Ambulatory Visit
Admission: RE | Admit: 2021-10-15 | Discharge: 2021-10-15 | Disposition: A | Discharge status: Home / Self Care | Payer: Medicare Other | Source: Ambulatory Visit | Attending: Gastroenterology | Admitting: Gastroenterology

## 2021-10-15 DIAGNOSIS — Z77018 Contact with and (suspected) exposure to other hazardous metals: Secondary | ICD-10-CM

## 2021-10-15 DIAGNOSIS — K8689 Other specified diseases of pancreas: Secondary | ICD-10-CM

## 2021-10-15 DIAGNOSIS — R9389 Abnormal findings on diagnostic imaging of other specified body structures: Secondary | ICD-10-CM

## 2021-10-15 MED ORDER — GADOBENATE DIMEGLUMINE 529 MG/ML IV SOLN
20.0000 mL | Freq: Once | INTRAVENOUS | Status: AC | PRN
  Administered 2021-10-15: 20 mL via INTRAVENOUS

## 2021-11-12 ENCOUNTER — Other Ambulatory Visit: Payer: Self-pay | Admitting: Gastroenterology

## 2021-11-23 ENCOUNTER — Ambulatory Visit (HOSPITAL_COMMUNITY): Payer: Medicare Other | Admitting: Certified Registered Nurse Anesthetist

## 2021-11-23 ENCOUNTER — Encounter (HOSPITAL_COMMUNITY): Payer: Self-pay | Admitting: Gastroenterology

## 2021-11-23 ENCOUNTER — Other Ambulatory Visit: Payer: Self-pay

## 2021-11-23 ENCOUNTER — Encounter (HOSPITAL_COMMUNITY)
Admission: RE | Disposition: A | Discharge status: Home / Self Care | Payer: Self-pay | Source: Home / Self Care | Attending: Gastroenterology

## 2021-11-23 ENCOUNTER — Ambulatory Visit (HOSPITAL_COMMUNITY)
Admission: RE | Admit: 2021-11-23 | Discharge: 2021-11-23 | Disposition: A | Discharge status: Home / Self Care | Payer: Medicare Other | Attending: Gastroenterology | Admitting: Gastroenterology

## 2021-11-23 ENCOUNTER — Ambulatory Visit (HOSPITAL_BASED_OUTPATIENT_CLINIC_OR_DEPARTMENT_OTHER): Payer: Medicare Other | Admitting: Certified Registered Nurse Anesthetist

## 2021-11-23 DIAGNOSIS — R933 Abnormal findings on diagnostic imaging of other parts of digestive tract: Secondary | ICD-10-CM | POA: Diagnosis not present

## 2021-11-23 DIAGNOSIS — G4730 Sleep apnea, unspecified: Secondary | ICD-10-CM | POA: Insufficient documentation

## 2021-11-23 DIAGNOSIS — J45909 Unspecified asthma, uncomplicated: Secondary | ICD-10-CM | POA: Diagnosis not present

## 2021-11-23 DIAGNOSIS — I10 Essential (primary) hypertension: Secondary | ICD-10-CM

## 2021-11-23 DIAGNOSIS — I2510 Atherosclerotic heart disease of native coronary artery without angina pectoris: Secondary | ICD-10-CM | POA: Diagnosis not present

## 2021-11-23 DIAGNOSIS — Z87891 Personal history of nicotine dependence: Secondary | ICD-10-CM | POA: Insufficient documentation

## 2021-11-23 DIAGNOSIS — K8689 Other specified diseases of pancreas: Secondary | ICD-10-CM | POA: Diagnosis present

## 2021-11-23 HISTORY — PX: ESOPHAGOGASTRODUODENOSCOPY (EGD) WITH PROPOFOL: SHX5813

## 2021-11-23 HISTORY — PX: UPPER ESOPHAGEAL ENDOSCOPIC ULTRASOUND (EUS): SHX6562

## 2021-11-23 SURGERY — UPPER ESOPHAGEAL ENDOSCOPIC ULTRASOUND (EUS)
Anesthesia: Monitor Anesthesia Care

## 2021-11-23 MED ORDER — PROPOFOL 10 MG/ML IV BOLUS
INTRAVENOUS | Status: DC | PRN
  Administered 2021-11-23: 30 mg via INTRAVENOUS

## 2021-11-23 MED ORDER — LIDOCAINE 2% (20 MG/ML) 5 ML SYRINGE
INTRAMUSCULAR | Status: DC | PRN
  Administered 2021-11-23: 80 mg via INTRAVENOUS

## 2021-11-23 MED ORDER — LACTATED RINGERS IV SOLN
INTRAVENOUS | Status: AC | PRN
  Administered 2021-11-23: 10 mL/h via INTRAVENOUS

## 2021-11-23 MED ORDER — SODIUM CHLORIDE 0.9 % IV SOLN: INTRAVENOUS | Status: DC

## 2021-11-23 MED ORDER — PROPOFOL 500 MG/50ML IV EMUL
INTRAVENOUS | Status: DC | PRN
  Administered 2021-11-23: 150 ug/kg/min via INTRAVENOUS

## 2021-11-23 SURGICAL SUPPLY — 15 items

## 2021-11-23 NOTE — Anesthesia Postprocedure Evaluation (Signed)
Anesthesia Post Note  Patient: Larry [name]  Procedure(s) Performed: UPPER ESOPHAGEAL ENDOSCOPIC ULTRASOUND (EUS) ESOPHAGOGASTRODUODENOSCOPY (EGD) WITH PROPOFOL     Patient location during evaluation: PACU Anesthesia Type: MAC Level of consciousness: awake and alert Pain management: pain level controlled Vital Signs Assessment: post-procedure vital signs reviewed and stable Respiratory status: spontaneous breathing, nonlabored ventilation, respiratory function stable and patient connected to nasal cannula oxygen Cardiovascular status: stable and blood pressure returned to baseline Postop Assessment: no apparent nausea or vomiting Anesthetic complications: no   No notable events documented.  Last Vitals:  Vitals:   11/23/21 0941 11/23/21 0951  BP: (!) 112/57 (!) 117/59  Pulse: (!) 51 (!) 51  Resp: (!) 21 20  Temp:    SpO2: 100% 99%    Last Pain:  Vitals:   11/23/21 0951  TempSrc:   PainSc: 0-No pain                 Effie Berkshire

## 2021-11-23 NOTE — Anesthesia Preprocedure Evaluation (Signed)
Anesthesia Evaluation  Patient identified by MRN, date of birth, ID band Patient awake    Reviewed: Allergy & Precautions, NPO status , Patient's Chart, lab work & pertinent test results  Airway Mallampati: I  TM Distance: >3 FB Neck ROM: Full    Dental  (+) Teeth Intact, Dental Advisory Given   Pulmonary asthma , sleep apnea ,    breath sounds clear to auscultation       Cardiovascular hypertension, Pt. on medications + CAD   Rhythm:Regular Rate:Normal  Echo: - Left ventricle: The cavity size was normal. There was  moderate focal basal septal hypertrophyand mild concentric  hypertrophy of the ventricle. Systolic function was  normal. The estimated ejection fraction was in the range  of 60% to 65%. Wall motion was normal; there were no  regional wall motion abnormalities. Doppler parameters are  consistent with abnormal left ventricular relaxation  (grade 1 diastolic dysfunction). The E/e' ratio is <10.,  suggesting normal LV filling pressure.  - Aortic valve: Mildly calcified leaflets. Sclerosis without  stenosis. Trivial regurgitation.  - Mitral valve: Mild regurgitation.  - Left atrium: LA Volume/ BSA = 20.4 ml/m2 The atrium was  normal in size.  - Right ventricle: The cavity size was mildly dilated.  - Right atrium: The atrium was mildly dilated.  - Tricuspid valve: Moderate regurgitation.  - Pulmonic valve: Mild regurgitation.  - Pulmonary arteries: PA peak pressure: 44m Hg (S).  - Inferior vena cava: The vessel was normal in size; the  respirophasic diameter changes were in the normal range (=  50%); findings are consistent with normal central venous  pressure.     Neuro/Psych negative neurological ROS  negative psych ROS   GI/Hepatic negative GI ROS, Neg liver ROS,   Endo/Other  negative endocrine ROS  Renal/GU Renal disease     Musculoskeletal negative musculoskeletal ROS (+)    Abdominal Normal abdominal exam  (+)   Peds  Hematology negative hematology ROS (+)   Anesthesia Other Findings   Reproductive/Obstetrics                            Anesthesia Physical Anesthesia Plan  ASA: 2  Anesthesia Plan: MAC   Post-op Pain Management:    Induction: Intravenous  PONV Risk Score and Plan: 0 and Propofol infusion  Airway Management Planned: Natural Airway and Simple Face Mask  Additional Equipment: None  Intra-op Plan:   Post-operative Plan:   Informed Consent: I have reviewed the patients History and Physical, chart, labs and discussed the procedure including the risks, benefits and alternatives for the proposed anesthesia with the patient or authorized representative who has indicated his/her understanding and acceptance.       Plan Discussed with: CRNA  Anesthesia Plan Comments:         Anesthesia Quick Evaluation

## 2021-11-23 NOTE — Discharge Instructions (Signed)

## 2021-11-23 NOTE — Op Note (Signed)
[facility] Patient Name: Larry [name] Procedure Date: 11/23/2021 MRN: [id number] Attending MD: Carol Ada , MD Date of Birth: 11-04-1940 CSN: [id number] Age: 81 Admit Type: Outpatient Procedure:                Upper EUS Indications:              Dilated pancreatic duct on MRI Providers:                Carol Ada, MD, Benay Pillow, RN, Benetta Spar, Technician Referring MD:              Medicines:                Propofol per Anesthesia Complications:            No immediate complications. Estimated Blood Loss:     Estimated blood loss: none. Procedure:                Pre-Anesthesia Assessment:                           - Prior to the procedure, a History and Physical                            was performed, and patient medications and                            allergies were reviewed. The patient's tolerance of                            previous anesthesia was also reviewed. The risks                            and benefits of the procedure and the sedation                            options and risks were discussed with the patient.                            All questions were answered, and informed consent                            was obtained. Prior Anticoagulants: The patient has                            taken no previous anticoagulant or antiplatelet                            agents except for aspirin. ASA Grade Assessment:                            III - A patient with severe systemic disease. After  reviewing the risks and benefits, the patient was                            deemed in satisfactory condition to undergo the                            procedure.                           - Sedation was administered by an anesthesia                            professional. Deep sedation was attained.                           After obtaining informed consent, the endoscope was                             passed under direct vision. Throughout the                            procedure, the patient's blood pressure, pulse, and                            oxygen saturations were monitored continuously. The                            [id number] ([id number] Olympus linear ultrasound scope                            was introduced through the mouth, and advanced to                            the second part of duodenum. The upper EUS was                            accomplished without difficulty. The patient                            tolerated the procedure well. Scope In: Scope Out: Findings:      ENDOSONOGRAPHIC FINDING: :      The pancreatic duct had a dilated endosonographic appearance in the       pancreatic head. The pancreatic duct measured up to 5.6 mm in diameter.       There was no evidence of any masses, strictures, or stones causing the       focal dilation.      There was no sign of significant endosonographic abnormality in the       common bile duct. The maximum diameter of the duct was 3 mm. Impression:               - The pancreatic duct had a dilated endosonographic                            appearance in the pancreatic head. The pancreatic  duct measured up to 5 mm in diameter.                           - There was no sign of significant pathology in the                            common bile duct.                           - No specimens collected. Moderate Sedation:      Not Applicable - Patient had care per Anesthesia. Recommendation:           - Patient has a contact number available for                            emergencies. The signs and symptoms of potential                            delayed complications were discussed with the                            patient. Return to normal activities tomorrow.                            Written discharge instructions were provided to the                            patient.                            - Resume regular diet.                           - Continue present medications. Procedure Code(s):        --- Professional ---                           [id number], Esophagogastroduodenoscopy, flexible,                            transoral; with endoscopic ultrasound examination                            limited to the esophagus, stomach or duodenum, and                            adjacent structures Diagnosis Code(s):        --- Professional ---                           K86.89, Other specified diseases of pancreas                           R93.3, Abnormal findings on diagnostic imaging of  other parts of digestive tract CPT copyright 2019 [employer]. All rights reserved. The codes documented in this report are preliminary and upon coder review may  be revised to meet current compliance requirements. Carol Ada, MD Carol Ada, MD 11/23/2021 9:42:29 AM This report has been signed electronically. Number of Addenda: 0

## 2021-11-23 NOTE — H&P (Signed)
Larry [name] HPI: His weight is stable and it did mildly increase by 2 lbs.  He complains of high and low blood pressures as well as high and low blood sugar.  Last night he did not take his blood pressure medications as his BP was 106/60 mmHg.  Previously he was provided with Zenpep samples and he felt that it was beneficial for his bloating.   Past Medical History:  Diagnosis Date   Coronary artery disease    Hyperlipidemia    Hypertension    Obesity    Sleep apnea    not using C-PAP    Past Surgical History:  Procedure Laterality Date   abd aorta doppler  11/01/2003   abdmonial aorta doppler  10/18/08   abd aorta -tranverse measurement 2.6 x 2.5 cm proximal   CARDIAC CATHETERIZATION  09/08/2006   lad 40-50%   COLONOSCOPY WITH PROPOFOL N/A 08/27/2013   Procedure: COLONOSCOPY WITH PROPOFOL;  Surgeon: Beryle Beams, MD;  Location: [facility];  Service: Endoscopy;  Laterality: N/A;   DOPPLER ECHOCARDIOGRAPHY  11/01/2003   LV normal,mild concentric lv hypertrophy,right ventricle mildly dilated,left atrium moderately dilated,mild mitral regurg,mild tricuspid regurg. ,mild aortic regurg   NM MYOCAR PERF WALL MOTION  11/26/2010   EF 56% no ischemia ,low normal left ventricular EF    Family History  Problem Relation Age of Onset   Heart failure Father    Ovarian cancer Mother     Social History:  reports that he quit smoking about 18 years ago. His smoking use included cigarettes. He has a 20.00 pack-year smoking history. He has never used smokeless tobacco. He reports current alcohol use. He reports that he does not use drugs.  Allergies:  Allergies  Allergen Reactions   Atorvastatin     Muscle aches    Evolocumab Swelling    Repatha - Swelling of lips and tongue    Zetia [Ezetimibe]     Fatigue    Penicillins Itching and Rash    Medications: Scheduled: Continuous:  sodium chloride     lactated ringers 10 mL/hr at 11/23/21 0844    No results found for this or any  previous visit (from the past 24 hour(s)).   No results found.  ROS:  As stated above in the HPI otherwise negative.  Blood pressure 138/65, pulse 67, temperature (!) 97.5 F (36.4 C), temperature source Temporal, resp. rate 11, height '5\' 8"'$  (1.727 m), weight 90.3 kg, SpO2 98 %.    PE: Gen: NAD, Alert and Oriented HEENT:  [place]/AT, EOMI Neck: Supple, no LAD Lungs: CTA Bilaterally CV: RRR without M/G/R ABD: Soft, NTND, +BS Ext: No C/C/E  Assessment/Plan: 1) Dilated PD - EUS.  Larry [name] D 11/23/2021, 8:49 AM

## 2021-11-23 NOTE — Transfer of Care (Signed)
Immediate Anesthesia Transfer of Care Note  Patient: Larry [name]  Procedure(s) Performed: UPPER ESOPHAGEAL ENDOSCOPIC ULTRASOUND (EUS) ESOPHAGOGASTRODUODENOSCOPY (EGD) WITH PROPOFOL  Patient Location: Endoscopy Unit  Anesthesia Type:MAC  Level of Consciousness: awake, alert  and oriented  Airway & Oxygen Therapy: Patient Spontanous Breathing and Patient connected to face mask oxygen  Post-op Assessment: Report given to RN and Post -op Vital signs reviewed and stable  Post vital signs: Reviewed and stable  Last Vitals:  Vitals Value Taken Time  BP 121/59 11/23/21 0935  Temp 36.4 C 11/23/21 0935  Pulse 53 11/23/21 0936  Resp 22 11/23/21 0936  SpO2 100 % 11/23/21 0936  Vitals shown include unvalidated device data.  Last Pain:  Vitals:   11/23/21 0935  TempSrc: Temporal  PainSc: 0-No pain         Complications: No notable events documented.

## 2021-11-27 ENCOUNTER — Encounter (HOSPITAL_COMMUNITY): Payer: Self-pay | Admitting: Gastroenterology

## 2022-01-02 ENCOUNTER — Other Ambulatory Visit: Payer: Self-pay | Admitting: Cardiovascular Disease

## 2024-04-03 IMAGING — MR MR ABDOMEN WO/W CM MRCP
14 of 22 series · 26 of 48 positions shown · IV contrast (multihance)
Comparison: CT abdomen and pelvis 10/03/2021

CLINICAL DATA: Pancreatic duct dilatation

EXAM:
MRI ABDOMEN WITHOUT AND WITH CONTRAST (INCLUDING MRCP)
TECHNIQUE: Multiplanar multisequence MR imaging of the abdomen was performed
both before and after the administration of intravenous contrast.
Heavily T2-weighted images of the biliary and pancreatic ducts were
obtained, and three-dimensional MRCP images were rendered by post
processing.
CONTRAST:  20mL MULTIHANCE GADOBENATE DIMEGLUMINE 529 MG/ML IV SOLN

[Series 3: T2 · coronal · 5.0mm · 1.56mm/px · 1 of 35 slices shown (1 of 4)]
[im 1/35]
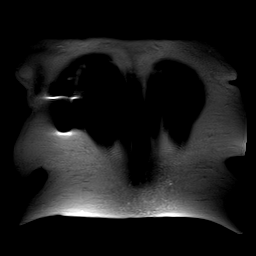

[Series 4: T2 · axial · 6.0mm · 1.56mm/px · 1 of 36 slices shown (2 of 4)]
[im 1/36]
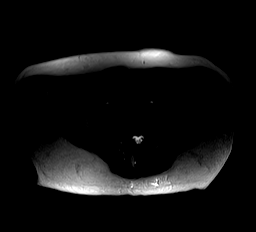

[Series 5: T2 · axial · 6.0mm · 0.78mm/px · 1 of 36 slices shown (3 of 4)]
[im 1/36]
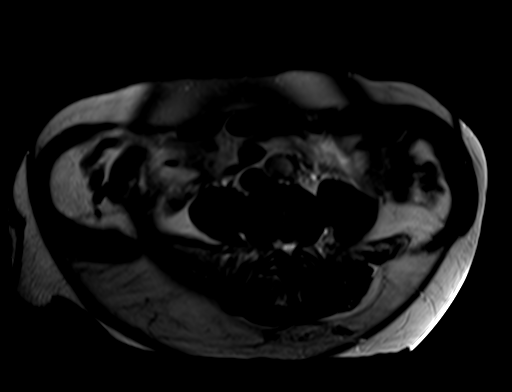

[Series 6: ep2d_diff_b50_500_800_p2-resp · 1 of 3 slices shown]
[im 1/3]
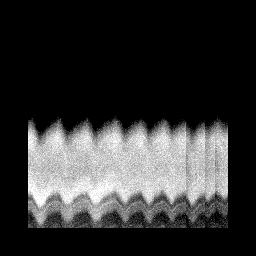

[Series 7: ep2d_diff_b50_500_800_p2 · axial · 6.0mm · 2.08mm/px · z∈[-181,+88]mm · 2 of 120 slices shown]
[im 1/120]
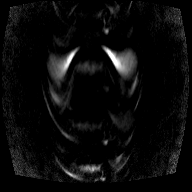
[im 120/120]
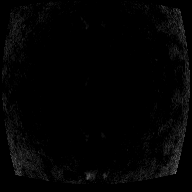

[Series 8: ep2d_diff_b50_500_800_p2_adc · axial · 6.0mm · 2.08mm/px · 1 of 40 slices shown]
[im 1/40]
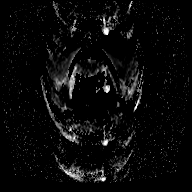

[Series 13: T2 · coronal · 3.0mm · 1.48mm/px · 1 of 39 slices shown (4 of 4)]
[im 1/39]
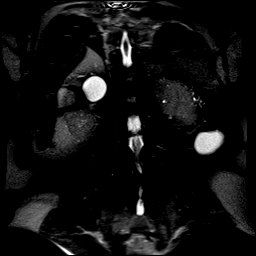

[Series 16: axial tru fisp · axial · 5.0mm · 1.56mm/px · 1 of 43 slices shown]
[im 1/43]
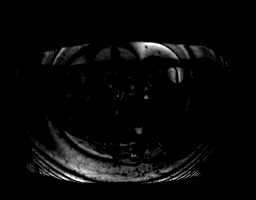

[Series 17: axial in out · axial · 6.0mm · 0.78mm/px · z∈[-164,+71]mm · 2 of 70 slices shown]
[im 1/70]
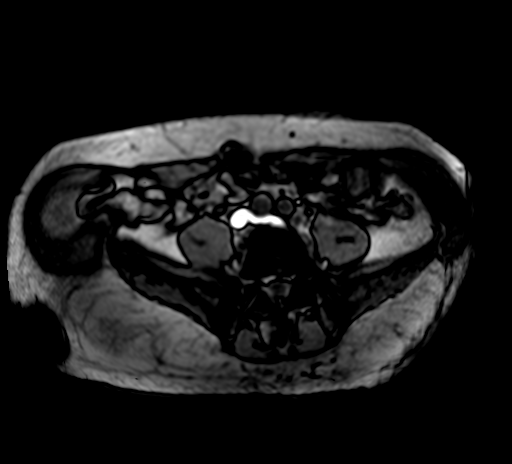
[im 70/70]
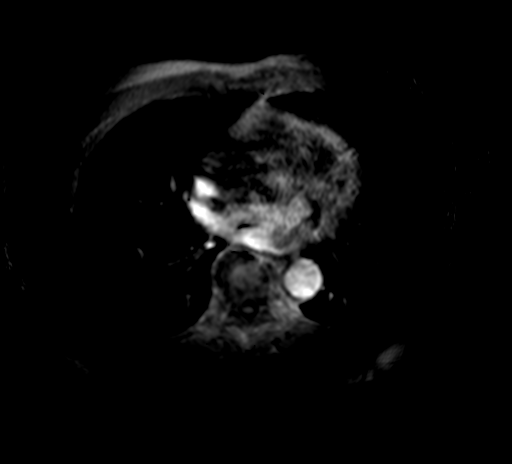

[Series 18: T1 dynamic · axial · non-contrast · 2.0mm · 0.78mm/px · z∈[-144,+78]mm · 3 of 112 slices shown]
[im 1/112]
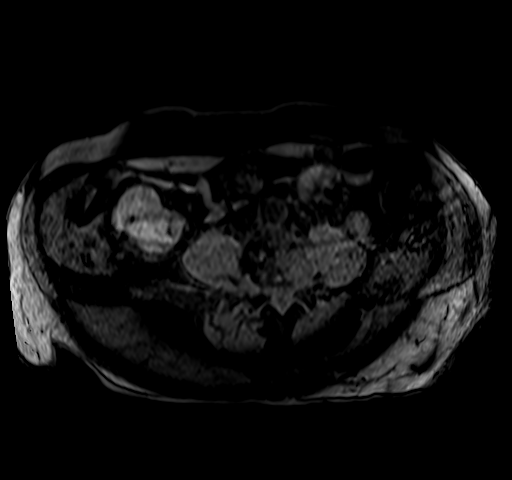
[im 56/112]
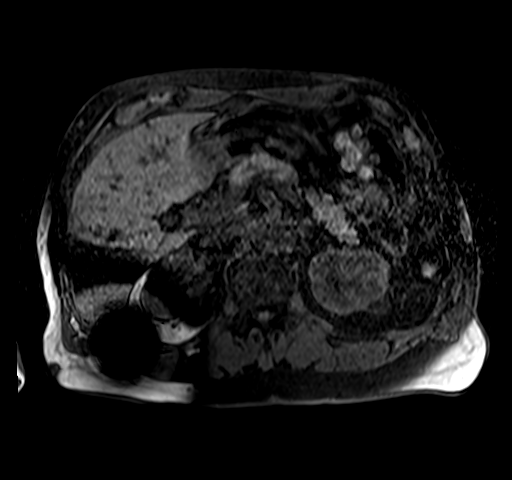
[im 112/112]
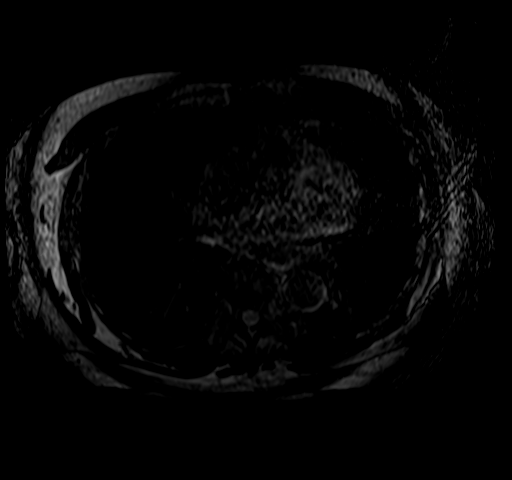

[Series 19: post 30 sec · axial · 2.0mm · 0.78mm/px · z∈[-144,+78]mm · 3 of 112 slices shown]
[im 1/112]
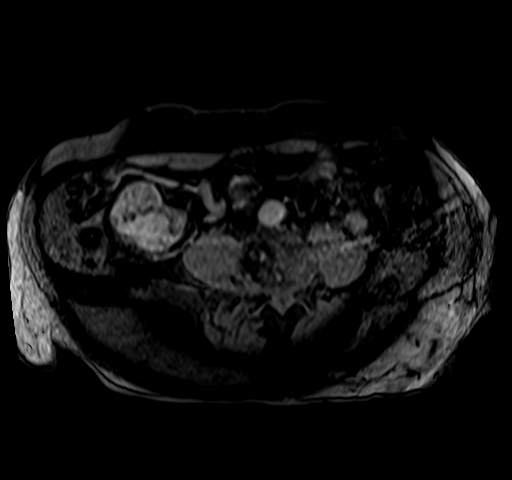
[im 56/112]
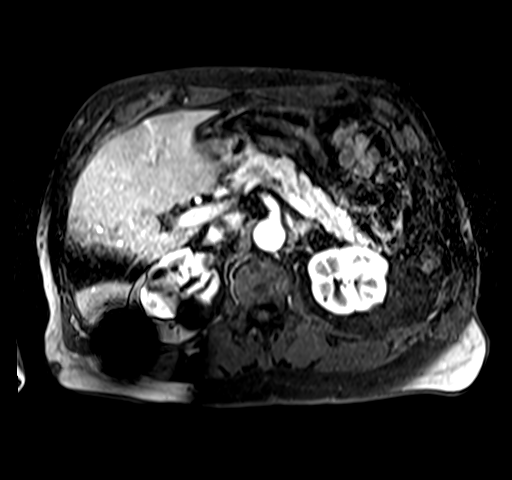
[im 112/112]
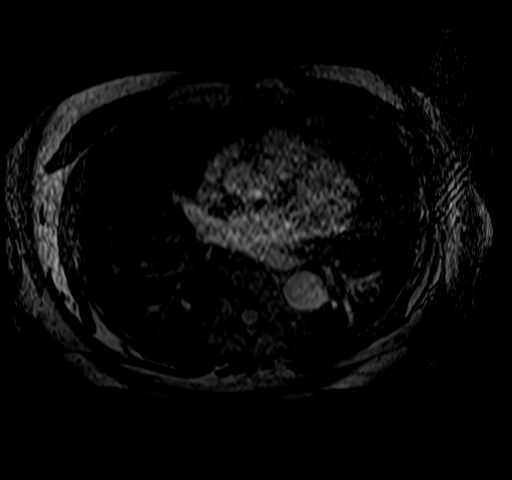

[Series 20: post 30 sec_sub · axial · 2.0mm · 0.78mm/px · z∈[-144,+78]mm · 3 of 112 slices shown]
[im 1/112]
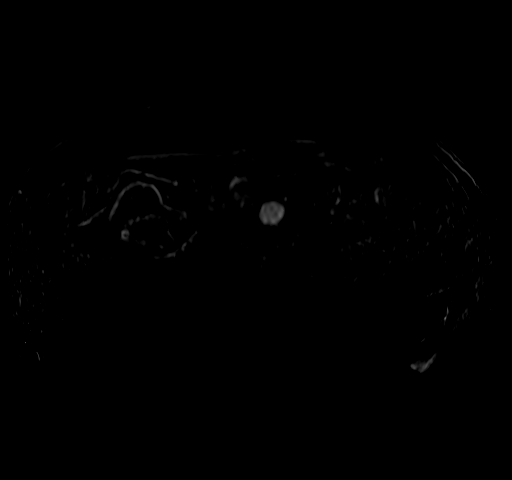
[im 56/112]
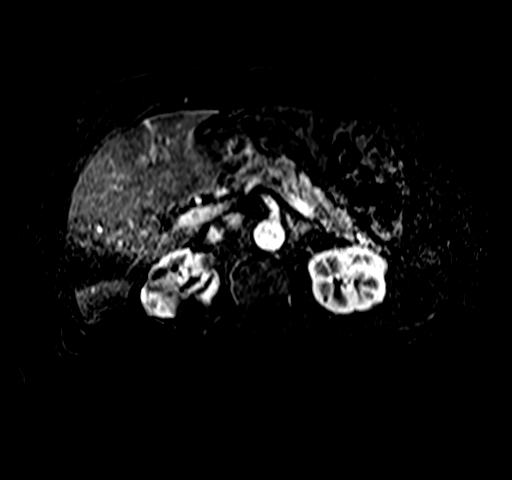
[im 112/112]
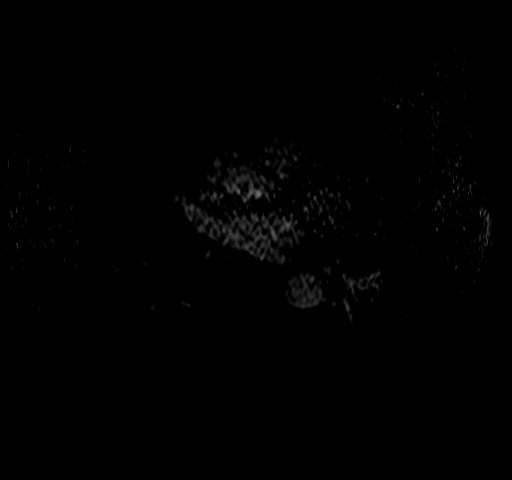

[Series 21: post 60 sec · axial · 2.0mm · 0.78mm/px · z∈[-144,+78]mm · 3 of 112 slices shown]
[im 1/112]
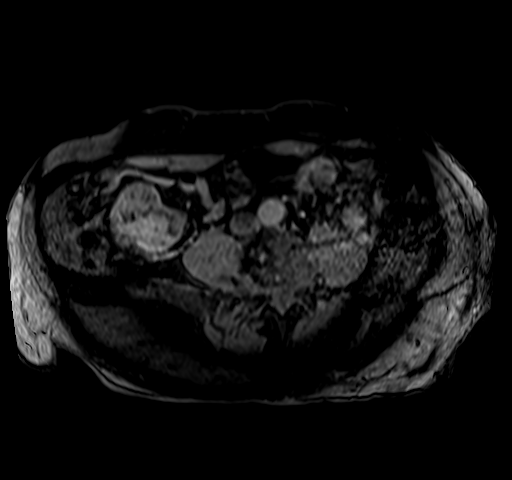
[im 56/112]
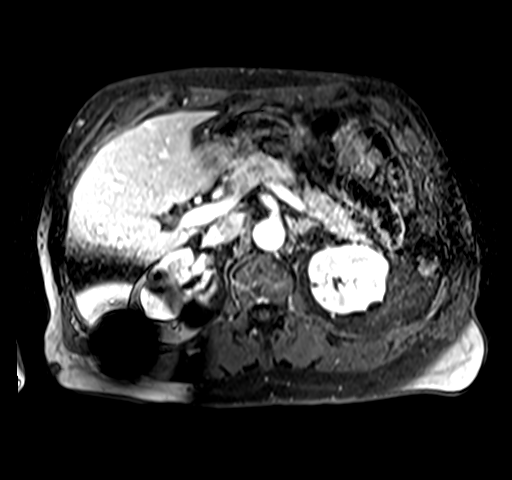
[im 112/112]
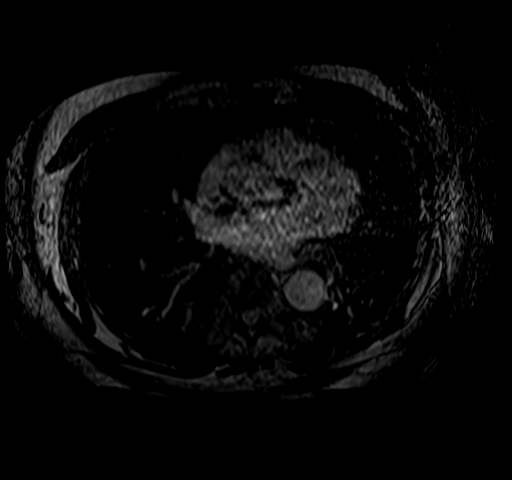

[Series 22: post 60 sec_sub · axial · 2.0mm · 0.78mm/px · z∈[-144,+78]mm · 3 of 112 slices shown]
[im 1/112]
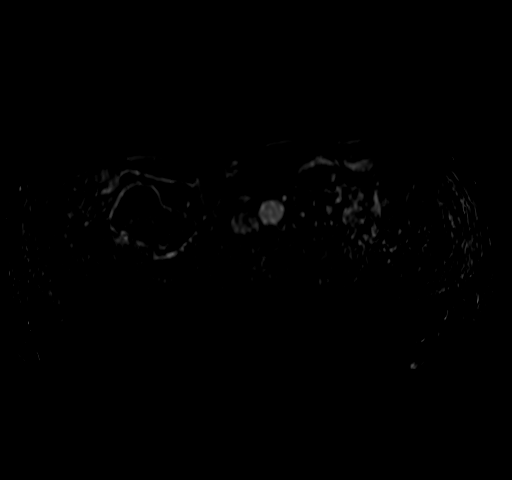
[im 56/112]
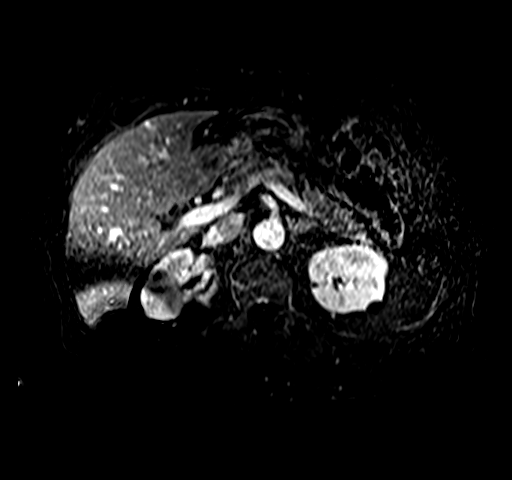
[im 112/112]
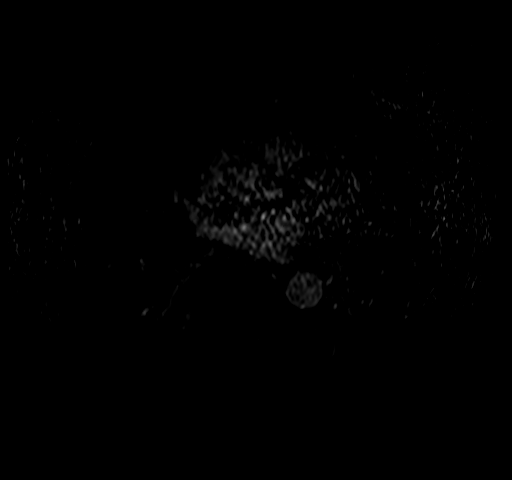

[26 of 48 positions shown; findings below may reference images not displayed]

FINDINGS: Lower chest: No acute findings.

Hepatobiliary: Liver is normal in size and contour with no
suspicious mass identified. A few subcentimeter hepatic cysts
scattered throughout the liver. Gallbladder appears normal. No
biliary ductal dilatation identified.

Pancreas: Persistent mild pancreatic ductal dilatation at the head
of the pancreas measuring 6 mm in diameter. No obstructing lesion
identified. Pancreas appears otherwise normal.

Spleen:  Within normal limits in size and appearance.

Adrenals/Urinary Tract: Adrenal glands are within normal limits.
Several renal cortical cysts identified bilaterally measuring up to
6.4 x 4.1 cm on the left and 4.1 cm on the right. Small renal
calculi partially visualized. Metallic artifacts noted posterior to
the right kidney.

Stomach/Bowel: No evidence of bowel obstruction. Colonic
diverticulosis.

Vascular/Lymphatic: No pathologically enlarged lymph nodes
identified. No abdominal aortic aneurysm demonstrated.

Other:  No ascites.

Musculoskeletal: No suspicious bony lesions.
IMPRESSION: 1. Stable mild pancreatic ductal dilatation in the head of the
pancreas with no obstructing lesion identified.
2. Colonic diverticulosis.
3. Small hepatic cysts.
4. Renal cysts.
